# Patient Record
Sex: Male | Born: 1965 | Race: Asian | Hispanic: No | Marital: Single | Smoking: Never smoker
Health system: Southern US, Community
[De-identification: ages and names within clinical notes are randomized; demographics above are authoritative.]

## PROBLEM LIST (undated history)

## (undated) DIAGNOSIS — E119 Type 2 diabetes mellitus without complications: Secondary | ICD-10-CM

## (undated) DIAGNOSIS — K649 Unspecified hemorrhoids: Secondary | ICD-10-CM

## (undated) DIAGNOSIS — K219 Gastro-esophageal reflux disease without esophagitis: Secondary | ICD-10-CM

## (undated) DIAGNOSIS — I10 Essential (primary) hypertension: Secondary | ICD-10-CM

## (undated) DIAGNOSIS — B1920 Unspecified viral hepatitis C without hepatic coma: Secondary | ICD-10-CM

## (undated) HISTORY — DX: Type 2 diabetes mellitus without complications: E11.9

## (undated) HISTORY — DX: Unspecified hemorrhoids: K64.9

## (undated) HISTORY — DX: Essential (primary) hypertension: I10

## (undated) HISTORY — DX: Unspecified viral hepatitis C without hepatic coma: B19.20

## (undated) HISTORY — DX: Gastro-esophageal reflux disease without esophagitis: K21.9

## (undated) HISTORY — PX: APPENDECTOMY: SHX54

---

## 2012-09-10 ENCOUNTER — Encounter (HOSPITAL_COMMUNITY): Payer: Self-pay | Admitting: Emergency Medicine

## 2012-09-10 ENCOUNTER — Emergency Department (HOSPITAL_COMMUNITY)
Admission: EM | Admit: 2012-09-10 | Discharge: 2012-09-11 | Disposition: A | Discharge status: Home / Self Care | Payer: BC Managed Care – PPO | Attending: Emergency Medicine | Admitting: Emergency Medicine

## 2012-09-10 DIAGNOSIS — E876 Hypokalemia: Secondary | ICD-10-CM | POA: Insufficient documentation

## 2012-09-10 DIAGNOSIS — E1169 Type 2 diabetes mellitus with other specified complication: Secondary | ICD-10-CM | POA: Insufficient documentation

## 2012-09-10 DIAGNOSIS — E162 Hypoglycemia, unspecified: Secondary | ICD-10-CM

## 2012-09-10 DIAGNOSIS — Z9089 Acquired absence of other organs: Secondary | ICD-10-CM | POA: Insufficient documentation

## 2012-09-10 DIAGNOSIS — R109 Unspecified abdominal pain: Secondary | ICD-10-CM | POA: Insufficient documentation

## 2012-09-10 DIAGNOSIS — Z794 Long term (current) use of insulin: Secondary | ICD-10-CM | POA: Insufficient documentation

## 2012-09-10 DIAGNOSIS — R7401 Elevation of levels of liver transaminase levels: Secondary | ICD-10-CM

## 2012-09-10 DIAGNOSIS — R7402 Elevation of levels of lactic acid dehydrogenase (LDH): Secondary | ICD-10-CM | POA: Insufficient documentation

## 2012-09-10 DIAGNOSIS — Z79899 Other long term (current) drug therapy: Secondary | ICD-10-CM | POA: Insufficient documentation

## 2012-09-10 DIAGNOSIS — R000 Tachycardia, unspecified: Secondary | ICD-10-CM | POA: Insufficient documentation

## 2012-09-10 HISTORY — DX: Type 2 diabetes mellitus without complications: E11.9

## 2012-09-10 LAB — POCT I-STAT, CHEM 8
Chloride: 100 mEq/L (ref 96–112)
HCT: 51 % (ref 39.0–52.0)
Hemoglobin: 17.3 g/dL — ABNORMAL HIGH (ref 13.0–17.0)
Potassium: 3.2 mEq/L — ABNORMAL LOW (ref 3.5–5.1)
Sodium: 140 mEq/L (ref 135–145)

## 2012-09-10 LAB — CBC WITH DIFFERENTIAL/PLATELET
Basophils Absolute: 0 10*3/uL (ref 0.0–0.1)
Eosinophils Relative: 0 % (ref 0–5)
HCT: 44.2 % (ref 39.0–52.0)
Lymphocytes Relative: 12 % (ref 12–46)
MCV: 72.2 fL — ABNORMAL LOW (ref 78.0–100.0)
Monocytes Absolute: 0.3 10*3/uL (ref 0.1–1.0)
Monocytes Relative: 4 % (ref 3–12)
RDW: 14.5 % (ref 11.5–15.5)
WBC: 8.2 10*3/uL (ref 4.0–10.5)

## 2012-09-10 LAB — GLUCOSE, CAPILLARY
Glucose-Capillary: 157 mg/dL — ABNORMAL HIGH (ref 70–99)
Glucose-Capillary: 98 mg/dL (ref 70–99)

## 2012-09-10 MED ORDER — DEXTROSE 50 % IV SOLN
INTRAVENOUS | Status: AC
  Administered 2012-09-10: 50 mL
  Filled 2012-09-10: qty 50

## 2012-09-10 NOTE — ED Notes (Signed)
PT. REPORTS LEFT FLANK PAIN FOR SEVERAL DAYS , DENIES DYSURIA OR HEMATURIA , SLIGHT CONCENTRATED URINE , HYPOGLYCEMIC AT TRIAGE.

## 2012-09-10 NOTE — ED Notes (Signed)
Pt. Given juice and [place] sandwich at triage , alert and oriebnted , slight diffused abdominal pain .

## 2012-09-11 LAB — COMPREHENSIVE METABOLIC PANEL
AST: 59 U/L — ABNORMAL HIGH (ref 0–37)
BUN: 8 mg/dL (ref 6–23)
CO2: 20 mEq/L (ref 19–32)
Calcium: 10.3 mg/dL (ref 8.4–10.5)
Creatinine, Ser: 0.76 mg/dL (ref 0.50–1.35)
GFR calc Af Amer: >90 mL/min (ref >90)
GFR calc non Af Amer: >90 mL/min (ref >90)
Glucose, Bld: 230 mg/dL — ABNORMAL HIGH (ref 70–99)

## 2012-09-11 LAB — URINALYSIS, ROUTINE W REFLEX MICROSCOPIC
Nitrite: NEGATIVE
Protein, ur: NEGATIVE mg/dL
Specific Gravity, Urine: 1.015 (ref 1.005–1.030)
Urobilinogen, UA: 0.2 mg/dL (ref 0.0–1.0)

## 2012-09-11 LAB — GLUCOSE, CAPILLARY: Glucose-Capillary: 199 mg/dL — ABNORMAL HIGH (ref 70–99)

## 2012-09-11 MED ORDER — POTASSIUM CHLORIDE CRYS ER 20 MEQ PO TBCR: 20.0000 meq | EXTENDED_RELEASE_TABLET | Freq: Two times a day (BID) | ORAL | Status: DC

## 2012-09-11 MED ORDER — NAPROXEN 500 MG PO TABS: 500.0000 mg | ORAL_TABLET | Freq: Two times a day (BID) | ORAL | Status: DC

## 2012-09-11 MED ORDER — KETOROLAC TROMETHAMINE 30 MG/ML IJ SOLN
30.0000 mg | Freq: Once | INTRAMUSCULAR | Status: AC
  Administered 2012-09-11: 30 mg via INTRAVENOUS
  Filled 2012-09-11: qty 1

## 2012-09-11 MED ORDER — SODIUM CHLORIDE 0.9 % IV BOLUS (SEPSIS)
1000.0000 mL | Freq: Once | INTRAVENOUS | Status: AC
  Administered 2012-09-11: 1000 mL via INTRAVENOUS

## 2012-09-11 MED ORDER — POTASSIUM CHLORIDE CRYS ER 20 MEQ PO TBCR
40.0000 meq | EXTENDED_RELEASE_TABLET | Freq: Once | ORAL | Status: AC
  Administered 2012-09-11: 40 meq via ORAL
  Filled 2012-09-11: qty 2

## 2012-09-11 NOTE — ED Provider Notes (Signed)
History     CSN: [id number]  Arrival date & time 09/10/12  2210   First MD Initiated Contact with Patient 09/10/12 2307      Chief Complaint  Patient presents with  . Flank Pain  . Hypoglycemia    (Consider location/radiation/quality/duration/timing/severity/associated sxs/prior treatment) HPI Comments: 47 year old male with a history of diabetes, recent appendectomy one month ago that was done laparoscopically.  He presents with a burning type pain which is on his left flank and side at his iliac crest which has been constant, nothing seems to make it better or worse and it is not associated with nausea vomiting or dysuria or hematuria. He has no abdominal pain, no swelling, no rashes, no chest pain, no cough, no fevers.  Patient is a 47 y.o. male presenting with flank pain. The history is provided by the patient.  Flank Pain    Past Medical History  Diagnosis Date  . Diabetes mellitus without complication     Past Surgical History  Procedure Date  . Appendectomy     No family history on file.  History  Substance Use Topics  . Smoking status: Never Smoker   . Smokeless tobacco: Not on file  . Alcohol Use: No      Review of Systems  Genitourinary: Positive for flank pain.  All other systems reviewed and are negative.    Allergies  Review of patient's allergies indicates no known allergies.  Home Medications   Current Outpatient Rx  Name  Route  Sig  Dispense  Refill  . GABAPENTIN 300 MG PO CAPS   Oral   Take 300 mg by mouth 3 (three) times daily.         . INSULIN DETEMIR 100 UNIT/ML [place] SOLN   Subcutaneous   Inject 58 Units into the skin daily.         Marland Kitchen LISINOPRIL 2.5 MG PO TABS   Oral   Take 2.5 mg by mouth daily.         Marland Kitchen METFORMIN HCL 500 MG PO TABS   Oral   Take 500 mg by mouth 2 (two) times daily with a meal.         . PANTOPRAZOLE SODIUM 40 MG PO TBEC   Oral   Take 40 mg by mouth daily.         Marland Kitchen NAPROXEN 500 MG PO TABS  Oral   Take 1 tablet (500 mg total) by mouth 2 (two) times daily with a meal.   30 tablet   0   . POTASSIUM CHLORIDE CRYS ER 20 MEQ PO TBCR   Oral   Take 1 tablet (20 mEq total) by mouth 2 (two) times daily.   10 tablet   0     BP 122/77  Pulse 115  Temp 97.3 F (36.3 C) (Oral)  Resp 22  SpO2 100%  Physical Exam  Nursing note and vitals reviewed. Constitutional: He appears well-developed and well-nourished. No distress.  HENT:  Head: Normocephalic and atraumatic.  Mouth/Throat: Oropharynx is clear and moist. No oropharyngeal exudate.  Eyes: Conjunctivae normal and EOM are normal. Pupils are equal, round, and reactive to light. Right eye exhibits no discharge. Left eye exhibits no discharge. No scleral icterus.  Neck: Normal range of motion. Neck supple. No JVD present. No thyromegaly present.  Cardiovascular: Normal rate, regular rhythm, normal heart sounds and intact distal pulses.  Exam reveals no gallop and no friction rub.   No murmur heard. Pulmonary/Chest: Effort normal and  breath sounds normal. No respiratory distress. He has no wheezes. He has no rales.  Abdominal: Soft. Bowel sounds are normal. He exhibits no distension and no mass. There is no tenderness.       There are surgical scars that are well-healed from his appendectomy, the patient is otherwise totally benign with no abdominal tenderness, no guarding, very soft and normal bowel sounds. no CVA tenderness  Musculoskeletal: Normal range of motion. He exhibits no edema and no tenderness.  Lymphadenopathy:    He has no cervical adenopathy.  Neurological: He is alert. Coordination normal.  Skin: Skin is warm and dry. No rash noted. No erythema.       There is no rash over the left flank and side  Psychiatric: He has a normal mood and affect. His behavior is normal.    ED Course  Procedures (including critical care time)  Labs Reviewed  GLUCOSE, CAPILLARY - Abnormal; Notable for the following:     Glucose-Capillary 23 (*)     All other components within normal limits  CBC WITH DIFFERENTIAL - Abnormal; Notable for the following:    RBC 6.12 (*)     MCV 72.2 (*)     MCH 24.2 (*)     Neutrophils Relative 84 (*)     All other components within normal limits  COMPREHENSIVE METABOLIC PANEL - Abnormal; Notable for the following:    Sodium 134 (*)     Chloride 92 (*)     Glucose, Bld 230 (*)     Total Protein 9.0 (*)     AST 59 (*)     ALT 107 (*)     All other components within normal limits  POCT I-STAT, CHEM 8 - Abnormal; Notable for the following:    Potassium 3.2 (*)     Glucose, Bld 59 (*)     Hemoglobin 17.3 (*)     All other components within normal limits  URINALYSIS, ROUTINE W REFLEX MICROSCOPIC - Abnormal; Notable for the following:    Glucose, UA 500 (*)     All other components within normal limits  GLUCOSE, CAPILLARY - Abnormal; Notable for the following:    Glucose-Capillary 157 (*)     All other components within normal limits  GLUCOSE, CAPILLARY - Abnormal; Notable for the following:    Glucose-Capillary 199 (*)     All other components within normal limits  GLUCOSE, CAPILLARY   No results found.   1. Side pain   2. Transaminitis   3. Hypoglycemia       MDM  The patient does have a mild tachycardia. He does not have a leukocytosis, he does not have a significant anemia but he does have a slight hypokalemia. Did have a low blood sugar at 23, he was given food in triage, he will be given IV fluids and recheck blood sugar, patient appears stable at this time. I doubt that this is a complication of the surgery given his completely soft and nontender abdomen.  The patient has had significant improvement in his blood sugar after eating though it was significant at 23 on arrival it has improved to over 100. It has stayed in that range and he has maintained a normal mental status since that time. His laboratory workup shows mild hypokalemia and a mild  transaminitis, no other significant findings. The patient has been made aware of these results, will need followup for recheck, patient agrees with same.      Vida Roller,  MD 09/11/12 0302

## 2012-09-19 ENCOUNTER — Encounter (HOSPITAL_COMMUNITY): Payer: Self-pay | Admitting: Family Medicine

## 2012-09-19 ENCOUNTER — Emergency Department (HOSPITAL_COMMUNITY): Payer: BC Managed Care – PPO

## 2012-09-19 ENCOUNTER — Emergency Department (HOSPITAL_COMMUNITY)
Admission: EM | Admit: 2012-09-19 | Discharge: 2012-09-19 | Disposition: A | Discharge status: Home / Self Care | Payer: BC Managed Care – PPO | Attending: Emergency Medicine | Admitting: Emergency Medicine

## 2012-09-19 DIAGNOSIS — R109 Unspecified abdominal pain: Secondary | ICD-10-CM | POA: Insufficient documentation

## 2012-09-19 DIAGNOSIS — Z794 Long term (current) use of insulin: Secondary | ICD-10-CM | POA: Insufficient documentation

## 2012-09-19 DIAGNOSIS — Z79899 Other long term (current) drug therapy: Secondary | ICD-10-CM | POA: Insufficient documentation

## 2012-09-19 DIAGNOSIS — R110 Nausea: Secondary | ICD-10-CM | POA: Insufficient documentation

## 2012-09-19 DIAGNOSIS — E119 Type 2 diabetes mellitus without complications: Secondary | ICD-10-CM | POA: Insufficient documentation

## 2012-09-19 DIAGNOSIS — Z9089 Acquired absence of other organs: Secondary | ICD-10-CM | POA: Insufficient documentation

## 2012-09-19 LAB — COMPREHENSIVE METABOLIC PANEL
Albumin: 4.3 g/dL (ref 3.5–5.2)
Alkaline Phosphatase: 115 U/L (ref 39–117)
BUN: 8 mg/dL (ref 6–23)
Calcium: 9.6 mg/dL (ref 8.4–10.5)
Creatinine, Ser: 0.87 mg/dL (ref 0.50–1.35)
GFR calc Af Amer: >90 mL/min (ref >90)
Glucose, Bld: 240 mg/dL — ABNORMAL HIGH (ref 70–99)
Potassium: 4.5 mEq/L (ref 3.5–5.1)
Total Protein: 7.7 g/dL (ref 6.0–8.3)

## 2012-09-19 LAB — CBC WITH DIFFERENTIAL/PLATELET
Basophils Relative: 0 % (ref 0–1)
Eosinophils Absolute: 0.1 10*3/uL (ref 0.0–0.7)
Eosinophils Relative: 2 % (ref 0–5)
Hemoglobin: 13.1 g/dL (ref 13.0–17.0)
Lymphs Abs: 2 10*3/uL (ref 0.7–4.0)
MCH: 24.6 pg — ABNORMAL LOW (ref 26.0–34.0)
MCHC: 32.8 g/dL (ref 30.0–36.0)
MCV: 75 fL — ABNORMAL LOW (ref 78.0–100.0)
Monocytes Relative: 8 % (ref 3–12)
Neutrophils Relative %: 42 % — ABNORMAL LOW (ref 43–77)

## 2012-09-19 LAB — URINALYSIS, ROUTINE W REFLEX MICROSCOPIC
Bilirubin Urine: NEGATIVE
Hgb urine dipstick: NEGATIVE
Ketones, ur: NEGATIVE mg/dL
Nitrite: NEGATIVE
Urobilinogen, UA: 0.2 mg/dL (ref 0.0–1.0)
pH: 6 (ref 5.0–8.0)

## 2012-09-19 MED ORDER — SODIUM CHLORIDE 0.9 % IV BOLUS (SEPSIS)
1000.0000 mL | Freq: Once | INTRAVENOUS | Status: AC
  Administered 2012-09-19: 1000 mL via INTRAVENOUS

## 2012-09-19 MED ORDER — KETOROLAC TROMETHAMINE 30 MG/ML IJ SOLN
30.0000 mg | Freq: Once | INTRAMUSCULAR | Status: AC
  Administered 2012-09-19: 30 mg via INTRAVENOUS
  Filled 2012-09-19: qty 1

## 2012-09-19 MED ORDER — HYDROCODONE-ACETAMINOPHEN 5-325 MG PO TABS: 1.0000 | ORAL_TABLET | ORAL | Status: DC | PRN

## 2012-09-19 MED ORDER — NAPROXEN 500 MG PO TABS: 500.0000 mg | ORAL_TABLET | Freq: Two times a day (BID) | ORAL | Status: DC

## 2012-09-19 MED ORDER — ONDANSETRON HCL 4 MG/2ML IJ SOLN
4.0000 mg | Freq: Once | INTRAMUSCULAR | Status: AC
  Administered 2012-09-19: 4 mg via INTRAVENOUS
  Filled 2012-09-19: qty 2

## 2012-09-19 NOTE — ED Notes (Signed)
Per pt left lower back pain and flank pain. sts he gets hot and sweats.

## 2012-09-19 NOTE — ED Provider Notes (Signed)
History     CSN: [id number]  Arrival date & time 09/19/12  1627   First MD Initiated Contact with Patient 09/19/12 2003      Chief Complaint  Patient presents with  . Flank Pain    (Consider location/radiation/quality/duration/timing/severity/associated sxs/prior treatment) Patient is a 47 y.o. male presenting with flank pain. The history is provided by the patient. The history is limited by a language barrier. A language interpreter was used.  Flank Pain This is a chronic problem. The current episode started more than 1 month ago. The problem occurs constantly. The problem has been unchanged. Associated symptoms include nausea. Pertinent negatives include no abdominal pain, arthralgias, chest pain, congestion, fatigue, fever, headaches, rash, vomiting or weakness. Nothing aggravates the symptoms. He has tried NSAIDs for the symptoms. The treatment provided moderate relief.    Past Medical History  Diagnosis Date  . Diabetes mellitus without complication     Past Surgical History  Procedure Date  . Appendectomy     History reviewed. No pertinent family history.  History  Substance Use Topics  . Smoking status: Never Smoker   . Smokeless tobacco: Not on file  . Alcohol Use: No      Review of Systems  Constitutional: Negative for fever and fatigue.  HENT: Negative for congestion, rhinorrhea and postnasal drip.   Eyes: Negative for photophobia and visual disturbance.  Respiratory: Negative for chest tightness, shortness of breath and wheezing.   Cardiovascular: Negative for chest pain, palpitations and leg swelling.  Gastrointestinal: Positive for nausea. Negative for vomiting, abdominal pain and diarrhea.  Genitourinary: Positive for flank pain. Negative for urgency, frequency and difficulty urinating.  Musculoskeletal: Negative for back pain and arthralgias.  Skin: Negative for rash and wound.  Neurological: Positive for dizziness. Negative for weakness and  headaches.  Psychiatric/Behavioral: Negative for confusion and agitation.    Allergies  Review of patient's allergies indicates no known allergies.  Home Medications   Current Outpatient Rx  Name  Route  Sig  Dispense  Refill  . GABAPENTIN 300 MG PO CAPS   Oral   Take 900 mg by mouth 3 (three) times daily.          . INSULIN DETEMIR 100 UNIT/ML [place] SOLN   Subcutaneous   Inject 58 Units into the skin daily.         Marland Kitchen LISINOPRIL 5 MG PO TABS   Oral   Take 2.5 mg by mouth daily.         Marland Kitchen METFORMIN HCL 500 MG PO TABS   Oral   Take 1,000 mg by mouth 2 (two) times daily with a meal.          . NAPROXEN 500 MG PO TABS   Oral   Take 1 tablet (500 mg total) by mouth 2 (two) times daily with a meal.   30 tablet   0   . PANTOPRAZOLE SODIUM 40 MG PO TBEC   Oral   Take 40 mg by mouth daily.         Marland Kitchen POTASSIUM CHLORIDE CRYS ER 20 MEQ PO TBCR   Oral   Take 1 tablet (20 mEq total) by mouth 2 (two) times daily.   10 tablet   0   . HYDROCODONE-ACETAMINOPHEN 5-325 MG PO TABS   Oral   Take 1 tablet by mouth every 4 (four) hours as needed for pain.   6 tablet   0   . NAPROXEN 500 MG PO TABS   Oral  Take 1 tablet (500 mg total) by mouth 2 (two) times daily.   30 tablet   0     BP 132/84  Pulse 73  Temp 97.7 F (36.5 C) (Oral)  Resp 16  SpO2 100%  Physical Exam  Constitutional: He is oriented to person, place, and time. He appears well-developed and well-nourished. No distress.  HENT:  Head: Normocephalic and atraumatic.  Mouth/Throat: Oropharynx is clear and moist.  Eyes: EOM are normal. Pupils are equal, round, and reactive to light.  Neck: Normal range of motion. Neck supple.  Cardiovascular: Normal rate, regular rhythm, normal heart sounds and intact distal pulses.   Pulmonary/Chest: Effort normal and breath sounds normal. He has no wheezes. He has no rales.  Abdominal: Soft. Bowel sounds are normal. He exhibits no distension. There is no tenderness.  There is no rebound and no guarding.       No abdominal or left flank tenderness.  Musculoskeletal: Normal range of motion. He exhibits no edema and no tenderness.       No CVA tenderness.  Lymphadenopathy:    He has no cervical adenopathy.  Neurological: He is alert and oriented to person, place, and time. He displays normal reflexes. No cranial nerve deficit. He exhibits normal muscle tone. Coordination normal.  Skin: Skin is warm and dry. No rash noted.       No dermatomal skin rashes  Psychiatric: He has a normal mood and affect. His behavior is normal.    ED Course  Procedures (including critical care time)  Labs Reviewed  URINALYSIS, ROUTINE W REFLEX MICROSCOPIC - Abnormal; Notable for the following:    Glucose, UA 100 (*)     All other components within normal limits  CBC WITH DIFFERENTIAL - Abnormal; Notable for the following:    MCV 75.0 (*)     MCH 24.6 (*)     Neutrophils Relative 42 (*)     Lymphocytes Relative 49 (*)     All other components within normal limits  COMPREHENSIVE METABOLIC PANEL - Abnormal; Notable for the following:    Glucose, Bld 240 (*)     AST 123 (*)     ALT 181 (*)     Total Bilirubin 0.2 (*)     All other components within normal limits   Ct Abdomen Pelvis Wo Contrast  09/19/2012  *RADIOLOGY REPORT*  Clinical Data: Left lower back pain and flank pain.  CT ABDOMEN AND PELVIS WITHOUT CONTRAST  Technique:  Multidetector CT imaging of the abdomen and pelvis was performed following the standard protocol without intravenous contrast.  Comparison: None.  Findings: Minimal bibasilar atelectasis is noted.  The liver and spleen are unremarkable in appearance.  The gallbladder is within normal limits.  The pancreas and adrenal glands are unremarkable.  The kidneys are unremarkable in appearance.  There is no evidence of hydronephrosis.  No renal or ureteral stones are seen.  No perinephric stranding is appreciated.  No free fluid is identified.  The small  bowel is unremarkable in appearance.  The stomach is within normal limits.  No acute vascular abnormalities are seen.  The patient is status post appendectomy, with scattered associated postoperative change.  The colon is largely filled with stool and is unremarkable in appearance.  The bladder is mildly distended and grossly unremarkable.  The prostate remains normal in size.  No inguinal lymphadenopathy is seen.  No acute osseous abnormalities are identified.  Chronic bilateral pars defects are seen at L5, with mild grade  1 anterolisthesis of L5 on S1.  IMPRESSION:  1.  No acute abnormality seen within the abdomen or pelvis.  No evidence of hydronephrosis; no obstructing ureteral stones seen. 2.  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   Original Report Authenticated By: Tonia Ghent, M.D.      1. Left flank pain       MDM  72M with pmhx of DM and recently had appendectomy 1 month ago here with left flank pain, nausea, and dizziness for the last month. Was seen here 9 days ago for same but was also noted to be hypoglycemic. Exam as noted above. Vitals are stable. Afebrile. Abdomen soft and non-tender. No overlying rash. No concern for serious intra-abdominal pathology but due to patient's second visit will obtain non-constrast CT to look for small stone or maybe large renal mass vs bony lesion. Given Toradol and Zofran for symptoms along with IVF.  CT negative for acute pathology. Labs reveal baseline LFT elevation. Normal urine. Potassium normal. Pt feeling better after medications. Will d/c with rx for naproxen and 6 tablets of Vicodin. Instructed patient to see pcp for further evaluation of pain. [place] translator utilized during entire visit. Pt discharged in stable condition and had no further questions or concerns prior to leaving.        Johnnette Gourd, MD 09/20/12 [phone]

## 2012-09-20 NOTE — ED Provider Notes (Signed)
I saw and evaluated the patient, reviewed the resident's note and I agree with the findings and plan. Patient complaining of thoracic/CVA pain. He denies any dysuria, fever or vomiting. Labs are within normal limits. Urine is normal in CT shows no evidence of renal tumor, stone or other abnormalities. Feel most likely arthritis in nature and patient placed on naproxen which he states in the past has helped  Gwyneth Sprout, MD 09/20/12 2320

## 2013-01-14 ENCOUNTER — Emergency Department (HOSPITAL_COMMUNITY): Payer: BC Managed Care – PPO

## 2013-01-14 ENCOUNTER — Encounter (HOSPITAL_COMMUNITY): Payer: Self-pay

## 2013-01-14 ENCOUNTER — Emergency Department (HOSPITAL_COMMUNITY)
Admission: EM | Admit: 2013-01-14 | Discharge: 2013-01-14 | Disposition: A | Discharge status: Home / Self Care | Payer: BC Managed Care – PPO | Attending: Emergency Medicine | Admitting: Emergency Medicine

## 2013-01-14 DIAGNOSIS — R112 Nausea with vomiting, unspecified: Secondary | ICD-10-CM | POA: Insufficient documentation

## 2013-01-14 DIAGNOSIS — E119 Type 2 diabetes mellitus without complications: Secondary | ICD-10-CM | POA: Insufficient documentation

## 2013-01-14 DIAGNOSIS — Z79899 Other long term (current) drug therapy: Secondary | ICD-10-CM | POA: Insufficient documentation

## 2013-01-14 DIAGNOSIS — R109 Unspecified abdominal pain: Secondary | ICD-10-CM | POA: Insufficient documentation

## 2013-01-14 DIAGNOSIS — IMO0001 Reserved for inherently not codable concepts without codable children: Secondary | ICD-10-CM | POA: Insufficient documentation

## 2013-01-14 DIAGNOSIS — R5383 Other fatigue: Secondary | ICD-10-CM

## 2013-01-14 DIAGNOSIS — R5381 Other malaise: Secondary | ICD-10-CM

## 2013-01-14 DIAGNOSIS — Z794 Long term (current) use of insulin: Secondary | ICD-10-CM | POA: Insufficient documentation

## 2013-01-14 LAB — CBC WITH DIFFERENTIAL/PLATELET
Basophils Absolute: 0 10*3/uL (ref 0.0–0.1)
Basophils Relative: 0 % (ref 0–1)
Eosinophils Relative: 0 % (ref 0–5)
HCT: 45 % (ref 39.0–52.0)
Hemoglobin: 16 g/dL (ref 13.0–17.0)
Lymphocytes Relative: 37 % (ref 12–46)
Lymphs Abs: 1.9 10*3/uL (ref 0.7–4.0)
MCV: 69.6 fL — ABNORMAL LOW (ref 78.0–100.0)
Monocytes Relative: 7 % (ref 3–12)
Neutro Abs: 2.7 10*3/uL (ref 1.7–7.7)
RBC: 6.47 MIL/uL — ABNORMAL HIGH (ref 4.22–5.81)
RDW: 13.2 % (ref 11.5–15.5)
WBC: 5 10*3/uL (ref 4.0–10.5)

## 2013-01-14 LAB — URINALYSIS, ROUTINE W REFLEX MICROSCOPIC
Bilirubin Urine: NEGATIVE
Glucose, UA: NEGATIVE mg/dL
Hgb urine dipstick: NEGATIVE
Ketones, ur: 15 mg/dL — AB
Leukocytes, UA: NEGATIVE
Nitrite: NEGATIVE
Protein, ur: NEGATIVE mg/dL
Specific Gravity, Urine: 1.01 (ref 1.005–1.030)
Urobilinogen, UA: 1 mg/dL (ref 0.0–1.0)
pH: 8 (ref 5.0–8.0)

## 2013-01-14 LAB — BASIC METABOLIC PANEL
BUN: 12 mg/dL (ref 6–23)
CO2: 22 mEq/L (ref 19–32)
Chloride: 98 mEq/L (ref 96–112)
Creatinine, Ser: 1.09 mg/dL (ref 0.50–1.35)
GFR calc Af Amer: >90 mL/min (ref >90)
Potassium: 4.2 mEq/L (ref 3.5–5.1)

## 2013-01-14 LAB — D-DIMER, QUANTITATIVE: D-Dimer, Quant: <0.27 ug/mL-FEU (ref 0.00–0.48)

## 2013-01-14 LAB — CG4 I-STAT (LACTIC ACID): Lactic Acid, Venous: 2.04 mmol/L (ref 0.5–2.2)

## 2013-01-14 LAB — LIPASE, BLOOD: Lipase: 57 U/L (ref 11–59)

## 2013-01-14 LAB — GLUCOSE, CAPILLARY: Glucose-Capillary: 80 mg/dL (ref 70–99)

## 2013-01-14 MED ORDER — SODIUM CHLORIDE 0.9 % IV BOLUS (SEPSIS)
1000.0000 mL | Freq: Once | INTRAVENOUS | Status: AC
  Administered 2013-01-14: 1000 mL via INTRAVENOUS

## 2013-01-14 MED ORDER — MORPHINE SULFATE 4 MG/ML IJ SOLN
4.0000 mg | Freq: Once | INTRAMUSCULAR | Status: AC
  Administered 2013-01-14: 4 mg via INTRAVENOUS
  Filled 2013-01-14: qty 1

## 2013-01-14 MED ORDER — ONDANSETRON HCL 4 MG/2ML IJ SOLN
4.0000 mg | Freq: Once | INTRAMUSCULAR | Status: AC
Start: 2013-01-14 — End: 2013-01-14
  Administered 2013-01-14: 4 mg via INTRAVENOUS
  Filled 2013-01-14: qty 2

## 2013-01-14 MED ORDER — IOHEXOL 300 MG/ML  SOLN
50.0000 mL | Freq: Once | INTRAMUSCULAR | Status: AC | PRN
  Administered 2013-01-14: 50 mL via ORAL

## 2013-01-14 MED ORDER — IOHEXOL 300 MG/ML  SOLN
100.0000 mL | Freq: Once | INTRAMUSCULAR | Status: AC | PRN
  Administered 2013-01-14: 100 mL via INTRAVENOUS

## 2013-01-14 MED ORDER — ONDANSETRON HCL 4 MG/2ML IJ SOLN
4.0000 mg | Freq: Once | INTRAMUSCULAR | Status: AC
  Administered 2013-01-14: 4 mg via INTRAVENOUS
  Filled 2013-01-14: qty 2

## 2013-01-14 NOTE — ED Notes (Signed)
Per EMS nephew reports that patient was not easily aroused this morning. He is not acting like himself, did not want to get dressed.Denies CP, HA, no abdominal pain. He is following commands. Acknowledges emesis yesterday X1, no diarrhea. Recently had physical which he had blood drawn- nephew states physical was unremarkable.  145/92, 84, 100%  Hx DM cbg 77.

## 2013-01-14 NOTE — ED Notes (Addendum)
Patient speaks [place]ian. Complaining of chest pain and abdominal pain.  Periods of shaking. Unable to void at this time. Nephew is at bedside. Patient has been following commands as appropriately as to be expected. Patient requesting bread because he is hungry.

## 2013-01-14 NOTE — ED Notes (Signed)
[id number]<AV> Expected date:01/14/13<BR> Expected time:12:00 PM<BR> Means of arrival:Ambulance<BR> Comments:<BR> Weakness

## 2013-01-15 ENCOUNTER — Emergency Department (HOSPITAL_COMMUNITY)
Admission: EM | Admit: 2013-01-15 | Discharge: 2013-01-16 | Disposition: A | Discharge status: Home / Self Care | Payer: BC Managed Care – PPO | Attending: Emergency Medicine | Admitting: Emergency Medicine

## 2013-01-15 ENCOUNTER — Encounter (HOSPITAL_COMMUNITY): Payer: Self-pay | Admitting: *Deleted

## 2013-01-15 DIAGNOSIS — R1013 Epigastric pain: Secondary | ICD-10-CM

## 2013-01-15 DIAGNOSIS — Z9089 Acquired absence of other organs: Secondary | ICD-10-CM | POA: Insufficient documentation

## 2013-01-15 DIAGNOSIS — Z794 Long term (current) use of insulin: Secondary | ICD-10-CM | POA: Insufficient documentation

## 2013-01-15 DIAGNOSIS — E119 Type 2 diabetes mellitus without complications: Secondary | ICD-10-CM | POA: Insufficient documentation

## 2013-01-15 DIAGNOSIS — Z79899 Other long term (current) drug therapy: Secondary | ICD-10-CM | POA: Insufficient documentation

## 2013-01-15 DIAGNOSIS — R5381 Other malaise: Secondary | ICD-10-CM | POA: Insufficient documentation

## 2013-01-15 DIAGNOSIS — R110 Nausea: Secondary | ICD-10-CM

## 2013-01-15 DIAGNOSIS — R5383 Other fatigue: Secondary | ICD-10-CM | POA: Insufficient documentation

## 2013-01-15 LAB — GLUCOSE, CAPILLARY

## 2013-01-15 MED ORDER — HYDROMORPHONE HCL PF 1 MG/ML IJ SOLN
1.0000 mg | Freq: Once | INTRAMUSCULAR | Status: AC
  Administered 2013-01-15: 1 mg via INTRAVENOUS
  Filled 2013-01-15: qty 1

## 2013-01-15 MED ORDER — ASPIRIN 325 MG PO TABS
325.0000 mg | ORAL_TABLET | ORAL | Status: AC
  Administered 2013-01-15: 325 mg via ORAL
  Filled 2013-01-15: qty 1

## 2013-01-15 MED ORDER — LACTATED RINGERS IV BOLUS (SEPSIS)
500.0000 mL | Freq: Once | INTRAVENOUS | Status: AC
  Administered 2013-01-15: 500 mL via INTRAVENOUS

## 2013-01-15 MED ORDER — NITROGLYCERIN 0.4 MG SL SUBL
0.4000 mg | SUBLINGUAL_TABLET | SUBLINGUAL | Status: DC | PRN
  Filled 2013-01-15: qty 25

## 2013-01-15 MED ORDER — ONDANSETRON HCL 4 MG/2ML IJ SOLN
4.0000 mg | Freq: Once | INTRAMUSCULAR | Status: AC
  Administered 2013-01-15: 4 mg via INTRAVENOUS
  Filled 2013-01-15: qty 2

## 2013-01-15 NOTE — ED Notes (Signed)
Pt in c/o chest pain x2 days, was seen here Saturday for feeling fatigue, here today due to pain that started back today, pain to central chest, pt hyperventilating during triage and he states that is due to pain, c/o nausea but denies vomiting

## 2013-01-16 ENCOUNTER — Encounter (HOSPITAL_COMMUNITY): Payer: Self-pay

## 2013-01-16 ENCOUNTER — Emergency Department (HOSPITAL_COMMUNITY): Payer: BC Managed Care – PPO

## 2013-01-16 LAB — COMPREHENSIVE METABOLIC PANEL
ALT: 97 U/L — ABNORMAL HIGH (ref 0–53)
BUN: 8 mg/dL (ref 6–23)
CO2: 21 mEq/L (ref 19–32)
Calcium: 9.7 mg/dL (ref 8.4–10.5)
GFR calc Af Amer: >90 mL/min (ref >90)
GFR calc non Af Amer: 89 mL/min — ABNORMAL LOW (ref >90)
Glucose, Bld: 285 mg/dL — ABNORMAL HIGH (ref 70–99)
Sodium: 134 mEq/L — ABNORMAL LOW (ref 135–145)
Total Protein: 7.7 g/dL (ref 6.0–8.3)

## 2013-01-16 LAB — CBC WITH DIFFERENTIAL/PLATELET
Basophils Relative: 0 % (ref 0–1)
Eosinophils Absolute: 0 10*3/uL (ref 0.0–0.7)
Eosinophils Relative: 0 % (ref 0–5)
HCT: 40.6 % (ref 39.0–52.0)
Hemoglobin: 13.8 g/dL (ref 13.0–17.0)
Lymphs Abs: 2.2 10*3/uL (ref 0.7–4.0)
MCH: 23.9 pg — ABNORMAL LOW (ref 26.0–34.0)
MCHC: 34 g/dL (ref 30.0–36.0)
MCV: 70.4 fL — ABNORMAL LOW (ref 78.0–100.0)
Monocytes Absolute: 0.6 10*3/uL (ref 0.1–1.0)
Neutro Abs: 2.2 10*3/uL (ref 1.7–7.7)
RBC: 5.77 MIL/uL (ref 4.22–5.81)

## 2013-01-16 LAB — URINALYSIS, ROUTINE W REFLEX MICROSCOPIC
Bilirubin Urine: NEGATIVE
Glucose, UA: 500 mg/dL — AB
Ketones, ur: NEGATIVE mg/dL
Leukocytes, UA: NEGATIVE
Specific Gravity, Urine: 1.016 (ref 1.005–1.030)
pH: 7 (ref 5.0–8.0)

## 2013-01-16 LAB — HEPATITIS PANEL, ACUTE
HCV Ab: REACTIVE — AB
Hep A IgM: NEGATIVE
Hep B C IgM: NEGATIVE

## 2013-01-16 LAB — BILIRUBIN, DIRECT: Bilirubin, Direct: 0.1 mg/dL (ref 0.0–0.3)

## 2013-01-16 MED ORDER — HYDROCODONE-ACETAMINOPHEN 5-325 MG PO TABS
2.0000 | ORAL_TABLET | Freq: Once | ORAL | Status: AC
  Administered 2013-01-16: 2 via ORAL
  Filled 2013-01-16: qty 2

## 2013-01-16 MED ORDER — PANTOPRAZOLE SODIUM 40 MG PO TBEC
40.0000 mg | DELAYED_RELEASE_TABLET | Freq: Once | ORAL | Status: AC
  Administered 2013-01-16: 40 mg via ORAL
  Filled 2013-01-16: qty 1

## 2013-01-16 MED ORDER — KETOROLAC TROMETHAMINE 30 MG/ML IJ SOLN
15.0000 mg | Freq: Once | INTRAMUSCULAR | Status: AC
  Administered 2013-01-16: 15 mg via INTRAVENOUS
  Filled 2013-01-16: qty 1

## 2013-01-16 MED ORDER — OMEPRAZOLE 20 MG PO CPDR: 20.0000 mg | DELAYED_RELEASE_CAPSULE | Freq: Two times a day (BID) | ORAL | Status: DC

## 2013-01-16 MED ORDER — METOCLOPRAMIDE HCL 10 MG PO TABS: 10.0000 mg | ORAL_TABLET | Freq: Four times a day (QID) | ORAL | Status: DC

## 2013-01-16 MED ORDER — IOHEXOL 300 MG/ML  SOLN
50.0000 mL | Freq: Once | INTRAMUSCULAR | Status: AC | PRN
  Administered 2013-01-16: 50 mL via ORAL

## 2013-01-16 MED ORDER — HYDROCODONE-ACETAMINOPHEN 5-325 MG PO TABS: 1.0000 | ORAL_TABLET | Freq: Four times a day (QID) | ORAL | Status: DC | PRN

## 2013-01-16 MED ORDER — SUCRALFATE 1 G PO TABS: 1.0000 g | ORAL_TABLET | Freq: Four times a day (QID) | ORAL | Status: DC

## 2013-01-16 MED ORDER — IOHEXOL 300 MG/ML  SOLN
100.0000 mL | Freq: Once | INTRAMUSCULAR | Status: AC | PRN
  Administered 2013-01-16: 100 mL via INTRAVENOUS

## 2013-01-16 MED ORDER — METOCLOPRAMIDE HCL 5 MG/ML IJ SOLN
10.0000 mg | Freq: Once | INTRAMUSCULAR | Status: AC
  Administered 2013-01-16: 10 mg via INTRAVENOUS
  Filled 2013-01-16: qty 2

## 2013-01-16 MED ORDER — DIPHENHYDRAMINE HCL 50 MG/ML IJ SOLN
12.5000 mg | Freq: Once | INTRAMUSCULAR | Status: AC
  Administered 2013-01-16: 12.5 mg via INTRAVENOUS
  Filled 2013-01-16: qty 1

## 2013-01-16 NOTE — ED Notes (Signed)
Pt. Is not able to urinate this time.

## 2013-01-16 NOTE — ED Provider Notes (Signed)
History    51 six-year-old male with abdominal pain. There is a language. Most of the history is via an interpreter. Gradual onset of symptoms 3-4 days ago. Generally tired and fatigued. Denies any focal weakness. Patient is complaining of some mild upper abdominal pain, nausea and did vomit once the day before. No blood in his emesis. No diarrhea. No urinary complaints. Denies CP or shortness of breath. No sick contacts. No fever or chills. Little appetite and food doesn't taste good to him.   CSN: [id number]  Arrival date & time 01/14/13  1226   First MD Initiated Contact with Patient 01/14/13 1234      Chief Complaint  Patient presents with  . Weakness    (Consider location/radiation/quality/duration/timing/severity/associated sxs/prior treatment) HPI  Past Medical History  Diagnosis Date  . Diabetes mellitus without complication     Past Surgical History  Procedure Laterality Date  . Appendectomy      No family history on file.  History  Substance Use Topics  . Smoking status: Never Smoker   . Smokeless tobacco: Not on file  . Alcohol Use: No      Review of Systems  All systems reviewed and negative, other than as noted in HPI.   Allergies  Review of patient's allergies indicates no known allergies.  Home Medications   Current Outpatient Rx  Name  Route  Sig  Dispense  Refill  . gabapentin (NEURONTIN) 300 MG capsule   Oral   Take 900 mg by mouth 3 (three) times daily.          . insulin detemir (LEVEMIR) 100 UNIT/ML injection   Subcutaneous   Inject 58 Units into the skin daily.         Marland Kitchen lisinopril (PRINIVIL,ZESTRIL) 5 MG tablet   Oral   Take 2.5 mg by mouth daily.         . metformin (FORTAMET) 500 MG (OSM) 24 hr tablet   Oral   Take 1,000 mg by mouth 2 (two) times daily with a meal.         . pantoprazole (PROTONIX) 40 MG tablet   Oral   Take 40 mg by mouth daily.         Marland Kitchen pyridOXINE (VITAMIN B-6) 100 MG tablet   Oral   Take  100 mg by mouth daily.         Marland Kitchen HYDROcodone-acetaminophen (NORCO/VICODIN) 5-325 MG per tablet   Oral   Take 1-2 tablets by mouth every 6 (six) hours as needed for pain.   23 tablet   0   . metoCLOPramide (REGLAN) 10 MG tablet   Oral   Take 1 tablet (10 mg total) by mouth every 6 (six) hours. As needed for nausea or vomiting   30 tablet   0   . omeprazole (PRILOSEC) 20 MG capsule   Oral   Take 1 capsule (20 mg total) by mouth 2 (two) times daily.   60 capsule   0   . sucralfate (CARAFATE) 1 G tablet   Oral   Take 1 tablet (1 g total) by mouth 4 (four) times daily. 30 minutes before eating   60 tablet   0     BP 130/92  Pulse 93  Temp(Src) 98.5 F (36.9 C) (Oral)  Resp 20  SpO2 100%  Physical Exam  Nursing note and vitals reviewed. Constitutional: He appears well-developed and well-nourished. No distress.  HENT:  Head: Normocephalic and atraumatic.  Eyes: Conjunctivae are normal. Right  eye exhibits no discharge. Left eye exhibits no discharge.  Neck: Neck supple.  Cardiovascular: Normal rate, regular rhythm and normal heart sounds.  Exam reveals no gallop and no friction rub.   No murmur heard. Pulmonary/Chest: Breath sounds normal.  tachypneic around 30. Lungs clear.  Abdominal: Soft. He exhibits no distension. There is tenderness. There is no rebound and no guarding.  Mild epigastric tenderness w/o rebound ro guarding  Musculoskeletal: He exhibits no edema and no tenderness.  Lower extremities symmetric as compared to each other. No calf tenderness. Negative Homan's. No palpable cords.   Neurological: He is alert. No cranial nerve deficit. He exhibits normal muscle tone. Coordination normal.  Skin: Skin is warm and dry.  Psychiatric: His behavior is normal. Thought content normal.    ED Course  Procedures (including critical care time)  Labs Reviewed  CBC WITH DIFFERENTIAL - Abnormal; Notable for the following:    RBC 6.47 (*)    MCV 69.6 (*)    MCH  24.7 (*)    All other components within normal limits  BASIC METABOLIC PANEL - Abnormal; Notable for the following:    Glucose, Bld 116 (*)    GFR calc non Af Amer 80 (*)    All other components within normal limits  URINALYSIS, ROUTINE W REFLEX MICROSCOPIC - Abnormal; Notable for the following:    APPearance CLOUDY (*)    Ketones, ur 15 (*)    All other components within normal limits  CULTURE, BLOOD (ROUTINE X 2)  CULTURE, BLOOD (ROUTINE X 2)  GLUCOSE, CAPILLARY  LIPASE, BLOOD  D-DIMER, QUANTITATIVE  CG4 I-STAT (LACTIC ACID)   US Abdomen Complete  01/16/2013   *RADIOLOGY REPORT*  Clinical Data:  Lower quadrant abdominal pain.  COMPLETE ABDOMINAL ULTRASOUND  Comparison:  CT scan 01/14/2013.  Findings:  Gallbladder:  Echogenic sludge noted layering in the gallbladder. No gallbladder wall thickening, pericholecystic fluid or definite gallstones.  Common bile duct:  Normal in caliber measuring a maximum of 4.7mm.  Liver:  There is diffuse increased echogenicity of the liver and decreased through transmission consistent with fatty infiltration. No focal lesions or biliary dilatation.  IVC:  Normal caliber.  Pancreas:  Not well visualized due to overlying bowel gas but was normal on the recent CT scan.  Spleen:  Normal size and echogenicity without focal lesions.  Right Kidney:  10.0 cm in length. Normal renal cortical thickness and echogenicity without focal lesions or hydronephrosis.  Left Kidney:  10.8 cm in length. Normal renal cortical thickness and echogenicity without focal lesions or hydronephrosis.  Abdominal aorta:  Normal.  IMPRESSION:  1.  Layering echogenic sludge in the gallbladder but no definite gallstones or findings for acute cholecystitis. 2.  Normal caliber common bile duct. 3.  Diffuse fatty infiltration of the liver. 4.  Poor visualization of the pancreas.   Original Report Authenticated By: Rudie Meyer, M.D.   Ct Abdomen Pelvis W Contrast  01/16/2013   *RADIOLOGY REPORT*   Clinical Data: Epigastric pain.  CT ABDOMEN AND PELVIS WITH CONTRAST  Technique:  Multidetector CT imaging of the abdomen and pelvis was performed following the standard protocol during bolus administration of intravenous contrast.  Contrast: OMNIPAQUE IOHEXOL 300 MG/ML  SOLN  Comparison: 01/14/2013.  Findings: The lung bases are clear except for dependent atelectasis.  Mild diffuse fatty infiltration of the liver but no focal hepatic lesions or intrahepatic biliary dilatation.  Gallbladder is grossly normal.  No common bile duct dilatation.  The pancreas is normal.  The spleen is normal.  The adrenal glands and kidneys are normal.  The stomach, duodenum, small bowel and colon are unremarkable.  No inflammatory changes or mass lesions.  The appendix is surgically absent.  No mesenteric or retroperitoneal mass or adenopathy.  The aorta is normal in caliber.  The major branch vessels are patent.  The bladder is distended almost to the level of the umbilicus.  No pelvic mass, adenopathy or free pelvic fluid collections.  The prostate gland and seminal vesicles are unremarkable.  No inguinal mass or hernia.  The bony pelvis is intact.  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  IMPRESSION:  1.  Distended bladder almost to the level of the umbilicus. 2.  No other significant abdominal/pelvic findings demonstrated.   Original Report Authenticated By: Rudie Meyer, M.D.     1. Malaise and fatigue       MDM  58 six-year-old male with generalized malaise. Etiology is not completely clear. Low suspicion for emergent etiology. He did present with fairly significant tachypnea, but this has since improved with pain medications and fluids. Lungs are clear. Chest x-ray is clear as well. The rest of pulmonary embolus with a normal d-dimer. Does have some very mild epigastric tenderness. Possibly related to vomiting? CMP and lipase normal. CT abdomen pelvis is unremarkable as  well. Patient reports feeling much better prior to discharge. Return precautions discussed.       Raeford Razor, MD 01/16/13 1726

## 2013-01-16 NOTE — ED Provider Notes (Addendum)
History     CSN: [id number]  Arrival date & time 01/15/13  2301   First MD Initiated Contact with Patient 01/15/13 2333      Chief Complaint  Patient presents with  . Chest Pain   patient is not [place] speaking, family accompanying him translates  HPI Adam Horton is a 47 y.o. male presenting for the second time to the ER since Saturday, he did complain about fatigue and chest pain, chest pain started all last week however has gotten acutely worse tonight.  He points to the upper epigastrium, lower chest, says this is sharp, radiates to his back, complains about nausea, denies vomiting, lower abdominal pain, diarrhea, fevers, chills, shortness of breath. Patient has history of appendectomy. Patient has history of diabetes, no hypertension does not drink alcohol does not use tobacco.  Patient denies any hematochezia or melena but relates very light to chalky-colored stools for a week.   Past Medical History  Diagnosis Date  . Diabetes mellitus without complication     Past Surgical History  Procedure Laterality Date  . Appendectomy      History reviewed. No pertinent family history.  History  Substance Use Topics  . Smoking status: Never Smoker   . Smokeless tobacco: Not on file  . Alcohol Use: No      Review of Systems At least 10pt or greater review of systems completed and are negative except where specified in the HPI.  Allergies  Review of patient's allergies indicates no known allergies.  Home Medications   Current Outpatient Rx  Name  Route  Sig  Dispense  Refill  . gabapentin (NEURONTIN) 300 MG capsule   Oral   Take 900 mg by mouth 3 (three) times daily.          . insulin detemir (LEVEMIR) 100 UNIT/ML injection   Subcutaneous   Inject 58 Units into the skin daily.         Marland Kitchen lisinopril (PRINIVIL,ZESTRIL) 5 MG tablet   Oral   Take 2.5 mg by mouth daily.         . metformin (FORTAMET) 500 MG (OSM) 24 hr tablet   Oral   Take 1,000 mg by mouth 2  (two) times daily with a meal.         . pantoprazole (PROTONIX) 40 MG tablet   Oral   Take 40 mg by mouth daily.         Marland Kitchen pyridOXINE (VITAMIN B-6) 100 MG tablet   Oral   Take 100 mg by mouth daily.           BP 138/86  Pulse 78  Temp(Src) 98.1 F (36.7 C) (Oral)  Resp 38  SpO2 100%  Physical Exam  Nursing notes reviewed.  Electronic medical record reviewed. VITAL SIGNS:   Filed Vitals:   01/15/13 2311  BP: 138/86  Pulse: 78  Temp: 98.1 F (36.7 C)  TempSrc: Oral  Resp: 38  SpO2: 100%   CONSTITUTIONAL: Awake, oriented, appears in pain HENT: Atraumatic, normocephalic, oral mucosa pink and moist, airway patent. Nares patent without drainage. External ears normal. EYES: Conjunctiva clear, EOMI, PERRLA NECK: Trachea midline, non-tender, supple CARDIOVASCULAR: Normal heart rate, Normal rhythm, No murmurs, rubs, gallops PULMONARY/CHEST: Tachypneic. Clear to auscultation, no rhonchi, wheezes, or rales. Symmetrical breath sounds. Non-tender. ABDOMINAL: Non-distended, soft, tenderness to palpation in the epigastrium without rebound.  BS normal. NEUROLOGIC: Non-focal, moving all four extremities, no gross sensory or motor deficits. EXTREMITIES: No clubbing, cyanosis, or edema SKIN:  Warm, Dry, No erythema, No rash  ED Course  Procedures (including critical care time)  Date: 01/16/2013  Rate: 74  Rhythm: normal sinus rhythm  QRS Axis: normal  Intervals: normal  ST/T Wave abnormalities: ST elevation in V3 with J-point elevation  Conduction Disutrbances: none  Narrative Interpretation: CT findings suggestive of early repolarization, doubt acute coronary syndrome, second EKG shows a similar morphology.     Labs Reviewed  GLUCOSE, CAPILLARY - Abnormal; Notable for the following:    Glucose-Capillary 324 (*)    All other components within normal limits  CBC WITH DIFFERENTIAL - Abnormal; Notable for the following:    MCV 70.4 (*)    MCH 23.9 (*)    All other  components within normal limits  COMPREHENSIVE METABOLIC PANEL - Abnormal; Notable for the following:    Sodium 134 (*)    Glucose, Bld 285 (*)    AST 55 (*)    ALT 97 (*)    GFR calc non Af Amer 89 (*)    All other components within normal limits  LIPASE, BLOOD - Abnormal; Notable for the following:    Lipase 106 (*)    All other components within normal limits  URINALYSIS, ROUTINE W REFLEX MICROSCOPIC - Abnormal; Notable for the following:    Glucose, UA 500 (*)    All other components within normal limits  BILIRUBIN, DIRECT  TROPONIN I  HEPATITIS PANEL, ACUTE  POCT I-STAT TROPONIN I   US Abdomen Complete  01/16/2013   *RADIOLOGY REPORT*  Clinical Data:  Lower quadrant abdominal pain.  COMPLETE ABDOMINAL ULTRASOUND  Comparison:  CT scan 01/14/2013.  Findings:  Gallbladder:  Echogenic sludge noted layering in the gallbladder. No gallbladder wall thickening, pericholecystic fluid or definite gallstones.  Common bile duct:  Normal in caliber measuring a maximum of 4.74mm.  Liver:  There is diffuse increased echogenicity of the liver and decreased through transmission consistent with fatty infiltration. No focal lesions or biliary dilatation.  IVC:  Normal caliber.  Pancreas:  Not well visualized due to overlying bowel gas but was normal on the recent CT scan.  Spleen:  Normal size and echogenicity without focal lesions.  Right Kidney:  10.0 cm in length. Normal renal cortical thickness and echogenicity without focal lesions or hydronephrosis.  Left Kidney:  10.8 cm in length. Normal renal cortical thickness and echogenicity without focal lesions or hydronephrosis.  Abdominal aorta:  Normal.  IMPRESSION:  1.  Layering echogenic sludge in the gallbladder but no definite gallstones or findings for acute cholecystitis. 2.  Normal caliber common bile duct. 3.  Diffuse fatty infiltration of the liver. 4.  Poor visualization of the pancreas.   Original Report Authenticated By: Rudie Meyer, M.D.   Ct  Abdomen Pelvis W Contrast  01/16/2013   *RADIOLOGY REPORT*  Clinical Data: Epigastric pain.  CT ABDOMEN AND PELVIS WITH CONTRAST  Technique:  Multidetector CT imaging of the abdomen and pelvis was performed following the standard protocol during bolus administration of intravenous contrast.  Contrast: OMNIPAQUE IOHEXOL 300 MG/ML  SOLN  Comparison: 01/14/2013.  Findings: The lung bases are clear except for dependent atelectasis.  Mild diffuse fatty infiltration of the liver but no focal hepatic lesions or intrahepatic biliary dilatation.  Gallbladder is grossly normal.  No common bile duct dilatation.  The pancreas is normal. The spleen is normal.  The adrenal glands and kidneys are normal.  The stomach, duodenum, small bowel and colon are unremarkable.  No inflammatory changes or mass lesions.  The appendix is surgically absent.  No mesenteric or retroperitoneal mass or adenopathy.  The aorta is normal in caliber.  The major branch vessels are patent.  The bladder is distended almost to the level of the umbilicus.  No pelvic mass, adenopathy or free pelvic fluid collections.  The prostate gland and seminal vesicles are unremarkable.  No inguinal mass or hernia.  The bony pelvis is intact.  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  IMPRESSION:  1.  Distended bladder almost to the level of the umbilicus. 2.  No other significant abdominal/pelvic findings demonstrated.   Original Report Authenticated By: Rudie Meyer, M.D.   Ct Abdomen Pelvis W Contrast  01/14/2013   *RADIOLOGY REPORT*  Clinical Data: Chest pain.  Abdominal pain.  CT ABDOMEN AND PELVIS WITH CONTRAST  Technique:  Multidetector CT imaging of the abdomen and pelvis was performed following the standard protocol during bolus administration of intravenous contrast.  Contrast: OMNIPAQUE IOHEXOL 300 MG/ML  SOLN, 50mL OMNIPAQUE IOHEXOL 300 MG/ML  SOLN  Comparison: 09/19/2012  Findings: Diffuse hepatic  steatosis.  Gallbladder, spleen, pancreas, adrenal glands, kidneys are within normal limits.  Bladder is distended.  Unremarkable prostate.  No free fluid.  No abnormal adenopathy.  Post appendectomy clips.  Grade 1 L5-S1 spondylolisthesis.  IMPRESSION: No acute intra-abdominal or intrapelvic pathology.  Stable.   Original Report Authenticated By: Jolaine Click, M.D.   Dg Chest Portable 1 View  01/14/2013   *RADIOLOGY REPORT*  Clinical Data: Short of breath and cough  PORTABLE CHEST - 1 VIEW  Comparison: 11/21/2010  Findings: Heart is normal in size.  Lungs are clear.  No pleural effusion.  No pneumothorax.  No acute bony deformity.  IMPRESSION: No active cardiopulmonary disease.   Original Report Authenticated By: Jolaine Click, M.D.     1. Epigastric pain   2. Nausea       MDM  Initial EKG shows ST elevation in V2 and V3 with some J-point elevation suggestive of possible repolarization pattern, there are no reciprocal changes, serial EKG shows no change. Patient's pain appears to be more in the epigastrium, this is concerning with patient's history of chalky stools, nausea and pain radiating to the back. Bedside ultrasound of the patient's aorta shows a normal caliber aorta without aneurysm, this is not a complete evaluation is DID secure the middle portion of the aorta. However the patient's having most of his pain in the epigastrium, this portion of the aorta is of normal caliber.  Patient's LFTs are elevated, his lipase is slightly elevated at 102. Troponin is negative. Discussed initial EKG with cardiology Dr. Katha Cabal - degrees with early repolarization  No evidence for biliary obstruction, there is diffuse steatohepatoses with mild transaminitis, mild elevation in lipase I do not think this represents a pancreatitis as his CAT scan is unremarkable for any acute intra-abdominal process. Likewise cardiac workup is unremarkable, he has had negative EKG, 2 troponins which are negative.    On  further questioning and interview with the patient, he now describes his pain as a burning pain in the left upper quadrant, he says it feels like heartburn. Patient [place], he's had no previous history of parasites, schistosomiasis, malaria.  No recent travel.  Patient was given a GI cocktail, Reglan and Benadryl which improved his pain significantly.  Patient was initially mildly tachypneic however think this was due to discomfort, since treatment he has felt much better, vital  signs have normalized, noted tachycardia, no hypoxia. I feel like this patient is low risk for a pulmonary embolism, and it doesn't clinically correlate with his history. He is PERC negative.   Patient has a primary care physician in [place] however is moved to the [place] area. We'll [facility] the [facility] to establish primary care for his diabetes, we'll also refer him to GI is at sounds like by his history he may have a peptic ulcer. We'll treat with PPI, antiemetics, pain medicine. Also advised cessation of any and all NSAIDs.  I explained the diagnosis and have given explicit precautions to return to the ER including any other new or worsening symptoms. The patient understands and accepts the medical plan as it's been dictated and I have answered his questions. Discharge instructions concerning home care and prescriptions have been given.  The patient is STABLE and is discharged to home in good condition.       Jones Skene, MD 01/16/13 4782  Jones Skene, MD 01/16/13 9562

## 2013-01-17 ENCOUNTER — Telehealth (HOSPITAL_COMMUNITY): Payer: Self-pay | Admitting: Emergency Medicine

## 2013-01-17 NOTE — ED Notes (Signed)
+  HCV. Chart sent to EDP office for review.

## 2013-01-17 NOTE — ED Notes (Signed)
Patient has +Hepatitis.

## 2013-01-20 ENCOUNTER — Telehealth (HOSPITAL_COMMUNITY): Payer: Self-pay | Admitting: Emergency Medicine

## 2013-01-20 ENCOUNTER — Inpatient Hospital Stay (HOSPITAL_COMMUNITY): Payer: BC Managed Care – PPO

## 2013-01-20 ENCOUNTER — Inpatient Hospital Stay (HOSPITAL_COMMUNITY)
Admission: EM | Admit: 2013-01-20 | Discharge: 2013-01-28 | DRG: 182 | Disposition: A | Discharge status: Home / Self Care | Payer: BC Managed Care – PPO | Attending: Internal Medicine | Admitting: Internal Medicine

## 2013-01-20 ENCOUNTER — Encounter (HOSPITAL_COMMUNITY): Payer: Self-pay | Admitting: *Deleted

## 2013-01-20 ENCOUNTER — Emergency Department (HOSPITAL_COMMUNITY): Payer: BC Managed Care – PPO

## 2013-01-20 DIAGNOSIS — K2900 Acute gastritis without bleeding: Principal | ICD-10-CM | POA: Diagnosis present

## 2013-01-20 DIAGNOSIS — R1110 Vomiting, unspecified: Secondary | ICD-10-CM

## 2013-01-20 DIAGNOSIS — I10 Essential (primary) hypertension: Secondary | ICD-10-CM

## 2013-01-20 DIAGNOSIS — E119 Type 2 diabetes mellitus without complications: Secondary | ICD-10-CM

## 2013-01-20 DIAGNOSIS — R748 Abnormal levels of other serum enzymes: Secondary | ICD-10-CM

## 2013-01-20 DIAGNOSIS — Z8611 Personal history of tuberculosis: Secondary | ICD-10-CM

## 2013-01-20 DIAGNOSIS — R7989 Other specified abnormal findings of blood chemistry: Secondary | ICD-10-CM

## 2013-01-20 DIAGNOSIS — B1920 Unspecified viral hepatitis C without hepatic coma: Secondary | ICD-10-CM | POA: Diagnosis present

## 2013-01-20 DIAGNOSIS — K279 Peptic ulcer, site unspecified, unspecified as acute or chronic, without hemorrhage or perforation: Secondary | ICD-10-CM | POA: Diagnosis present

## 2013-01-20 DIAGNOSIS — R109 Unspecified abdominal pain: Secondary | ICD-10-CM

## 2013-01-20 DIAGNOSIS — R911 Solitary pulmonary nodule: Secondary | ICD-10-CM

## 2013-01-20 DIAGNOSIS — Z794 Long term (current) use of insulin: Secondary | ICD-10-CM

## 2013-01-20 DIAGNOSIS — J984 Other disorders of lung: Secondary | ICD-10-CM

## 2013-01-20 DIAGNOSIS — R932 Abnormal findings on diagnostic imaging of liver and biliary tract: Secondary | ICD-10-CM

## 2013-01-20 LAB — CBC WITH DIFFERENTIAL/PLATELET
Basophils Absolute: 0 10*3/uL (ref 0.0–0.1)
Eosinophils Absolute: 0 10*3/uL (ref 0.0–0.7)
Eosinophils Relative: 0 % (ref 0–5)
Lymphs Abs: 1 10*3/uL (ref 0.7–4.0)
MCH: 24.5 pg — ABNORMAL LOW (ref 26.0–34.0)
Monocytes Absolute: 0.1 10*3/uL (ref 0.1–1.0)
Neutrophils Relative %: 76 % (ref 43–77)
Platelets: 248 10*3/uL (ref 150–400)
RBC: 5.84 MIL/uL — ABNORMAL HIGH (ref 4.22–5.81)
RDW: 13.6 % (ref 11.5–15.5)

## 2013-01-20 LAB — URINALYSIS, ROUTINE W REFLEX MICROSCOPIC
Glucose, UA: >1000 mg/dL — AB
Hgb urine dipstick: NEGATIVE
Ketones, ur: NEGATIVE mg/dL
Protein, ur: NEGATIVE mg/dL

## 2013-01-20 LAB — URINE MICROSCOPIC-ADD ON

## 2013-01-20 LAB — CULTURE, BLOOD (ROUTINE X 2)
Culture: NO GROWTH
Culture: NO GROWTH

## 2013-01-20 LAB — COMPREHENSIVE METABOLIC PANEL
ALT: 79 U/L — ABNORMAL HIGH (ref 0–53)
AST: 35 U/L (ref 0–37)
CO2: 21 mEq/L (ref 19–32)
Chloride: 98 mEq/L (ref 96–112)
Creatinine, Ser: 1.04 mg/dL (ref 0.50–1.35)
GFR calc Af Amer: >90 mL/min (ref >90)
GFR calc non Af Amer: 84 mL/min — ABNORMAL LOW (ref >90)
Glucose, Bld: 254 mg/dL — ABNORMAL HIGH (ref 70–99)
Sodium: 131 mEq/L — ABNORMAL LOW (ref 135–145)
Total Bilirubin: 0.5 mg/dL (ref 0.3–1.2)

## 2013-01-20 LAB — MRSA PCR SCREENING: MRSA by PCR: NEGATIVE

## 2013-01-20 LAB — GLUCOSE, CAPILLARY: Glucose-Capillary: 127 mg/dL — ABNORMAL HIGH (ref 70–99)

## 2013-01-20 LAB — TROPONIN I: Troponin I: <0.3 ng/mL (ref <0.30)

## 2013-01-20 LAB — ETHANOL: Alcohol, Ethyl (B): <11 mg/dL (ref 0–11)

## 2013-01-20 MED ORDER — IOHEXOL 350 MG/ML SOLN
100.0000 mL | Freq: Once | INTRAVENOUS | Status: AC | PRN
  Administered 2013-01-20: 100 mL via INTRAVENOUS

## 2013-01-20 MED ORDER — INSULIN ASPART 100 UNIT/ML ~~LOC~~ SOLN
0.0000 [IU] | Freq: Three times a day (TID) | SUBCUTANEOUS | Status: DC
  Administered 2013-01-22 (×2): 2 [IU] via SUBCUTANEOUS

## 2013-01-20 MED ORDER — HYDROMORPHONE HCL PF 1 MG/ML IJ SOLN
1.0000 mg | Freq: Once | INTRAMUSCULAR | Status: AC
  Administered 2013-01-20: 1 mg via INTRAVENOUS
  Filled 2013-01-20: qty 1

## 2013-01-20 MED ORDER — DIPHENHYDRAMINE HCL 50 MG/ML IJ SOLN
INTRAMUSCULAR | Status: AC
  Filled 2013-01-20: qty 1

## 2013-01-20 MED ORDER — GI COCKTAIL ~~LOC~~
30.0000 mL | Freq: Three times a day (TID) | ORAL | Status: DC | PRN
  Administered 2013-01-23 – 2013-01-27 (×6): 30 mL via ORAL
  Filled 2013-01-20 (×9): qty 30

## 2013-01-20 MED ORDER — FAMOTIDINE IN NACL 20-0.9 MG/50ML-% IV SOLN
20.0000 mg | Freq: Once | INTRAVENOUS | Status: AC
  Administered 2013-01-20: 20 mg via INTRAVENOUS
  Filled 2013-01-20: qty 50

## 2013-01-20 MED ORDER — ONDANSETRON HCL 4 MG/2ML IJ SOLN
4.0000 mg | Freq: Once | INTRAMUSCULAR | Status: AC
  Administered 2013-01-20: 4 mg via INTRAVENOUS
  Filled 2013-01-20: qty 2

## 2013-01-20 MED ORDER — ONDANSETRON HCL 4 MG/2ML IJ SOLN
4.0000 mg | Freq: Four times a day (QID) | INTRAMUSCULAR | Status: DC | PRN
  Administered 2013-01-20 – 2013-01-21 (×3): 4 mg via INTRAVENOUS
  Filled 2013-01-20 (×3): qty 2

## 2013-01-20 MED ORDER — SODIUM CHLORIDE 0.9 % IV SOLN
INTRAVENOUS | Status: DC
  Administered 2013-01-20: 18:00:00 via INTRAVENOUS

## 2013-01-20 MED ORDER — ACETAMINOPHEN 650 MG RE SUPP: 650.0000 mg | Freq: Four times a day (QID) | RECTAL | Status: DC | PRN

## 2013-01-20 MED ORDER — ALBUTEROL SULFATE (5 MG/ML) 0.5% IN NEBU: 2.5000 mg | INHALATION_SOLUTION | RESPIRATORY_TRACT | Status: DC | PRN

## 2013-01-20 MED ORDER — ONDANSETRON HCL 4 MG PO TABS: 4.0000 mg | ORAL_TABLET | Freq: Four times a day (QID) | ORAL | Status: DC | PRN

## 2013-01-20 MED ORDER — HYDROMORPHONE HCL PF 1 MG/ML IJ SOLN
1.0000 mg | Freq: Once | INTRAMUSCULAR | Status: AC
  Administered 2013-01-20: 1 mg via INTRAVENOUS

## 2013-01-20 MED ORDER — DIPHENHYDRAMINE HCL 50 MG/ML IJ SOLN
12.5000 mg | Freq: Three times a day (TID) | INTRAMUSCULAR | Status: DC | PRN
  Administered 2013-01-20 – 2013-01-22 (×5): 12.5 mg via INTRAVENOUS
  Filled 2013-01-20 (×4): qty 1

## 2013-01-20 MED ORDER — PANTOPRAZOLE SODIUM 40 MG IV SOLR
40.0000 mg | Freq: Two times a day (BID) | INTRAVENOUS | Status: DC
  Administered 2013-01-20 – 2013-01-23 (×7): 40 mg via INTRAVENOUS
  Filled 2013-01-20 (×10): qty 40

## 2013-01-20 MED ORDER — ACETAMINOPHEN 325 MG PO TABS: 650.0000 mg | ORAL_TABLET | Freq: Four times a day (QID) | ORAL | Status: DC | PRN

## 2013-01-20 MED ORDER — SODIUM CHLORIDE 0.9 % IV BOLUS (SEPSIS)
1000.0000 mL | Freq: Once | INTRAVENOUS | Status: AC
  Administered 2013-01-20: 1000 mL via INTRAVENOUS

## 2013-01-20 MED ORDER — SODIUM CHLORIDE 0.9 % IJ SOLN
3.0000 mL | Freq: Two times a day (BID) | INTRAMUSCULAR | Status: DC
  Administered 2013-01-21 – 2013-01-28 (×10): 3 mL via INTRAVENOUS

## 2013-01-20 MED ORDER — GI COCKTAIL ~~LOC~~
30.0000 mL | Freq: Once | ORAL | Status: AC
  Administered 2013-01-20: 30 mL via ORAL
  Filled 2013-01-20: qty 30

## 2013-01-20 MED ORDER — SODIUM CHLORIDE 0.9 % IV SOLN
INTRAVENOUS | Status: DC
  Administered 2013-01-20 – 2013-01-22 (×5): via INTRAVENOUS

## 2013-01-20 MED ORDER — HYDROMORPHONE HCL PF 1 MG/ML IJ SOLN
1.0000 mg | INTRAMUSCULAR | Status: DC | PRN
  Administered 2013-01-20 – 2013-01-21 (×5): 1 mg via INTRAVENOUS
  Filled 2013-01-20 (×5): qty 1

## 2013-01-20 NOTE — ED Provider Notes (Signed)
History     CSN: [id number]  Arrival date & time 01/20/13  1256   First MD Initiated Contact with Patient 01/20/13 1330      Chief Complaint  Patient presents with  . Chest Pain  . Abdominal Pain    (Consider location/radiation/quality/duration/timing/severity/associated sxs/prior treatment) HPI Comments: Patient presents with complaint of continued chest pain and abdominal pain. Patient was seen in emergency department twice in the past week for the same symptoms. He had positive hep C antibody, negative CT, negative abdominal ultrasound. When asked where the pain is patient points from his neck to the lower portion of his abdomen. It is described as squeezing. It does seem to be worse in the upper abdomen and epigastrium. It is associated with nausea and vomiting. Denies alcohol, drug, heavy NSAID use. No fever. No change in urination or pain with urination. Patient has a history of stomach ulcers. The onset of this condition was acute. The course is constant. Aggravating factors: palpation. Alleviating factors: none.    Patient is a 47 y.o. male presenting with chest pain and abdominal pain. The history is provided by the patient.  Chest Pain Associated symptoms: abdominal pain, nausea and vomiting   Associated symptoms: no cough, no fever, no headache and no palpitations   Abdominal Pain Associated symptoms include abdominal pain, chest pain, nausea and vomiting. Pertinent negatives include no coughing, fever, headaches, myalgias, rash or sore throat.    Past Medical History  Diagnosis Date  . Diabetes mellitus without complication     Past Surgical History  Procedure Laterality Date  . Appendectomy      No family history on file.  History  Substance Use Topics  . Smoking status: Never Smoker   . Smokeless tobacco: Not on file  . Alcohol Use: No      Review of Systems  Constitutional: Negative for fever.  HENT: Negative for sore throat and rhinorrhea.   Eyes:  Negative for redness.  Respiratory: Negative for cough.   Cardiovascular: Positive for chest pain. Negative for palpitations and leg swelling.  Gastrointestinal: Positive for nausea, vomiting and abdominal pain. Negative for diarrhea and blood in stool.  Genitourinary: Negative for dysuria.  Musculoskeletal: Negative for myalgias.  Skin: Negative for rash.  Neurological: Negative for headaches.    Allergies  Review of patient's allergies indicates no known allergies.  Home Medications   Current Outpatient Rx  Name  Route  Sig  Dispense  Refill  . gabapentin (NEURONTIN) 300 MG capsule   Oral   Take 900 mg by mouth 3 (three) times daily.          Marland Kitchen HYDROcodone-acetaminophen (NORCO/VICODIN) 5-325 MG per tablet   Oral   Take 1-2 tablets by mouth every 6 (six) hours as needed for pain.   23 tablet   0   . insulin detemir (LEVEMIR) 100 UNIT/ML injection   Subcutaneous   Inject 58 Units into the skin daily.         Marland Kitchen lisinopril (PRINIVIL,ZESTRIL) 5 MG tablet   Oral   Take 2.5 mg by mouth daily.         . metformin (FORTAMET) 500 MG (OSM) 24 hr tablet   Oral   Take 1,000 mg by mouth 2 (two) times daily with a meal.         . metoCLOPramide (REGLAN) 10 MG tablet   Oral   Take 1 tablet (10 mg total) by mouth every 6 (six) hours. As needed for nausea or vomiting  30 tablet   0   . omeprazole (PRILOSEC) 20 MG capsule   Oral   Take 1 capsule (20 mg total) by mouth 2 (two) times daily.   60 capsule   0   . pantoprazole (PROTONIX) 40 MG tablet   Oral   Take 40 mg by mouth daily.         Marland Kitchen pyridOXINE (VITAMIN B-6) 100 MG tablet   Oral   Take 100 mg by mouth daily.         . sucralfate (CARAFATE) 1 G tablet   Oral   Take 1 tablet (1 g total) by mouth 4 (four) times daily. 30 minutes before eating   60 tablet   0     BP 110/78  Pulse 76  Temp(Src) 98.2 F (36.8 C) (Oral)  Resp 32  SpO2 100%  Physical Exam  Nursing note and vitals  reviewed. Constitutional: He appears well-developed and well-nourished.  HENT:  Head: Normocephalic and atraumatic.  Eyes: Conjunctivae are normal. Right eye exhibits no discharge. Left eye exhibits no discharge.  Neck: Normal range of motion. Neck supple.  Cardiovascular: Normal rate, regular rhythm and normal heart sounds.   Pulmonary/Chest: Effort normal and breath sounds normal.  Abdominal: Soft. Bowel sounds are decreased. There is generalized tenderness. There is no rigidity, no rebound, no guarding, no tenderness at McBurney's point and negative Murphy's sign.    Neurological: He is alert.  Skin: Skin is warm and dry.  Psychiatric: He has a normal mood and affect.    ED Course  Procedures (including critical care time)  Labs Reviewed  CBC WITH DIFFERENTIAL - Abnormal; Notable for the following:    RBC 5.84 (*)    MCV 71.2 (*)    MCH 24.5 (*)    Monocytes Relative 2 (*)    All other components within normal limits  COMPREHENSIVE METABOLIC PANEL - Abnormal; Notable for the following:    Sodium 131 (*)    Potassium 5.2 (*)    Glucose, Bld 254 (*)    ALT 79 (*)    GFR calc non Af Amer 84 (*)    All other components within normal limits  LIPASE, BLOOD - Abnormal; Notable for the following:    Lipase 82 (*)    All other components within normal limits  CG4 I-STAT (LACTIC ACID) - Abnormal; Notable for the following:    Lactic Acid, Venous 2.95 (*)    All other components within normal limits  URINALYSIS, ROUTINE W REFLEX MICROSCOPIC  POCT I-STAT TROPONIN I   Dg Abd Acute W/chest  01/20/2013   *RADIOLOGY REPORT*  Clinical Data: Abdominal pain  ACUTE ABDOMEN SERIES (ABDOMEN 2 VIEW & CHEST 1 VIEW)  Comparison: CT abdomen pelvis dated 01/16/2013  Findings: Lungs are essentially clear.  No focal consolidation.  No pleural effusion or pneumothorax.  Nonobstructive bowel gas pattern.  No evidence of free air under the diaphragm on the upright view.  Visualized osseous structures  are within normal limits.  IMPRESSION: No evidence of acute cardiopulmonary disease.  No evidence of small bowel obstruction or free air.   Original Report Authenticated By: Charline Bills, M.D.     1. Abdominal pain   2. Elevated lipase   3. Vomiting     1:44 PM Patient seen and examined. Work-up initiated. Medications ordered. Previous labs reviewed.   Vital signs reviewed and are as follows: Filed Vitals:   01/20/13 1307  BP:   Pulse:   Temp: 98.2 F (  36.8 C)  Resp:   BP 110/78  Pulse 76  Temp(Src) 98.2 F (36.8 C) (Oral)  Resp 32  SpO2 100%   Date: 01/20/2013  Rate: 76  Rhythm: normal sinus rhythm  QRS Axis: normal  Intervals: normal  ST/T Wave abnormalities: early repolarization  Conduction Disutrbances:none  Narrative Interpretation:   Old EKG Reviewed: unchanged from 01/15/13  Patient discussed with and seen by Dr. Bernette Mayers.   4:26 PM Patient was improved but now worsening to 8/10. Patient appears uncomfortable. I am not confident that we can control symptoms. I discussed this with the patient. He is reluctant, but agrees with admission to hospital for further evaluation.   4:50 PM Spoke with [facility] who will see and admit for further work-up, pain control.    MDM  Admit for abdominal pain of unclear etiology, failure of outpatient therapy.         Renne Crigler, PA-C 01/20/13 1650

## 2013-01-20 NOTE — ED Notes (Signed)
Pt remains in CT at this time.  

## 2013-01-20 NOTE — ED Notes (Signed)
Attempted report to [phone] 

## 2013-01-20 NOTE — ED Notes (Signed)
Patient in [facility] ED on 5/23. Notified Sheldon MD of +Hepatitis C result.

## 2013-01-20 NOTE — ED Notes (Addendum)
Per EMS- pt PCP in [place] called to follow up with pt today he told them that he had abdominal and chest pain/ PCP office called EMS. Family states that he has ulcers. Pt was given zofran en route for nausea. Pt reports "squeezing" pain in abdomen and up to chest and throat. Pt states that he has chest, back, leg pain and headache

## 2013-01-20 NOTE — ED Notes (Signed)
Scanned pts bladder with bladder scanner, read 

## 2013-01-20 NOTE — ED Notes (Signed)
Attempted report 

## 2013-01-20 NOTE — Consult Note (Signed)
[facility] Consultation  Referring Provider: No ref. provider found Primary Care Physician:  No PCP Per Patient Primary Gastroenterologist:  none.  Reason for Consultation:  Upper abdominal pain, severe, this is the third visit to ED since 08/2012  HPI:    Adam Horton is a 47 y.o.[place] male, with constant and persistent LUQ and epigastric pain at least since Jan 2014,,this time he is admitted.for further evaluation. The pain is associated with nausea and vomiting. There is a language barrier, so complete history is not possible. But he denies fever although he has shivers, Blood tests show mild elevation on 2 occassions, also elevated LFT's and positive Hep C antibody.CT angio tonight shows lung lesion and liver lesions suggestive of TB or metastatic disease.He has been on Naproxen 500mg  for abd pain given to him in ED.     Component Value Date/Time   SDES BLOOD LEFT HAND 01/14/2013 1331   SPECREQUEST BOTTLES DRAWN AEROBIC ONLY 3CC 01/14/2013 1331   CULT NO GROWTH 5 DAYS 01/14/2013 1331   REPTSTATUS 01/20/2013 FINAL 01/14/2013 1331     Past Medical History  Diagnosis Date  . Diabetes mellitus without complication     Past Surgical History  Procedure Laterality Date  . Appendectomy      Prior to Admission medications   Medication Sig Start Date End Date Taking? Authorizing Provider  insulin detemir (LEVEMIR) 100 UNIT/ML injection Inject 58 Units into the skin daily.   Yes Historical Provider, MD  lisinopril (PRINIVIL,ZESTRIL) 5 MG tablet Take 2.5 mg by mouth daily.   Yes Historical Provider, MD  metformin (FORTAMET) 500 MG (OSM) 24 hr tablet Take 1,000 mg by mouth 2 (two) times daily with a meal.   Yes Historical Provider, MD  metoCLOPramide (REGLAN) 10 MG tablet Take 1 tablet (10 mg total) by mouth every 6 (six) hours. As needed for nausea or vomiting 01/16/13  Yes John-Adam Bonk, MD  naproxen (NAPROSYN) 500 MG tablet Take 500 mg by mouth 2 (two) times daily as  needed (pain).    Yes Historical Provider, MD  omeprazole (PRILOSEC) 20 MG capsule Take 20 mg by mouth daily as needed (for stomach pain).   Yes Historical Provider, MD  OVER THE COUNTER MEDICATION Place 1 drop into the left eye 2 (two) times daily as needed (otc eye drop prn pain).   Yes Historical Provider, MD  pantoprazole (PROTONIX) 40 MG tablet Take 40 mg by mouth daily.   Yes Historical Provider, MD  pyridOXINE (VITAMIN B-6) 100 MG tablet Take 100 mg by mouth daily.   Yes Historical Provider, MD  sucralfate (CARAFATE) 1 G tablet Take 1 tablet (1 g total) by mouth 4 (four) times daily. 30 minutes before eating 01/16/13  Yes John-Adam Bonk, MD    Current Facility-Administered Medications  Medication Dose Route Frequency Provider Last Rate Last Dose  . 0.9 %  sodium chloride infusion   Intravenous Continuous Maretta Bees, MD 150 mL/hr at 01/20/13 2010    . acetaminophen (TYLENOL) tablet 650 mg  650 mg Oral Q6H PRN Shanker Levora Dredge, MD       Or  . acetaminophen (TYLENOL) suppository 650 mg  650 mg Rectal Q6H PRN Shanker Levora Dredge, MD      . albuterol (PROVENTIL) (5 MG/ML) 0.5% nebulizer solution 2.5 mg  2.5 mg Nebulization Q2H PRN Shanker Levora Dredge, MD      . diphenhydrAMINE (BENADRYL) 50 MG/ML injection           . diphenhydrAMINE (BENADRYL) injection  12.5 mg  12.5 mg Intravenous Q8H PRN Jinger Neighbors, NP   12.5 mg at 01/20/13 2124  . gi cocktail (Maalox,Lidocaine,Donnatal)  30 mL Oral TID PRN Maretta Bees, MD      . HYDROmorphone (DILAUDID) injection 1 mg  1 mg Intravenous Q4H PRN Maretta Bees, MD   1 mg at 01/20/13 2034  . [START ON 01/21/2013] insulin aspart (novoLOG) injection 0-9 Units  0-9 Units Subcutaneous TID WC Shanker Levora Dredge, MD      . ondansetron [facility]) tablet 4 mg  4 mg Oral Q6H PRN Maretta Bees, MD       Or  . ondansetron [facility]) injection 4 mg  4 mg Intravenous Q6H PRN Maretta Bees, MD   4 mg at 01/20/13 2034  . pantoprazole (PROTONIX) injection  40 mg  40 mg Intravenous Q12H Maretta Bees, MD   40 mg at 01/20/13 1817  . sodium chloride 0.9 % injection 3 mL  3 mL Intravenous Q12H Maretta Bees, MD        Allergies as of 01/20/2013  . (No Known Allergies)    No family history on file.  History   Social History  . Marital Status: Single    Spouse Name: N/A    Number of Children: N/A  . Years of Education: N/A   Occupational History  . Not on file.   Social History Main Topics  . Smoking status: Never Smoker   . Smokeless tobacco: Not on file  . Alcohol Use: No  . Drug Use: No  . Sexually Active: Not on file   Other Topics Concern  . Not on file   Social History Narrative  . No narrative on file    Review of Systems:denies dysphagia, positive for heartburn, N&V All other systems reviewed and negative except where noted in HPI.    Physical Exam:   Vital signs in last 24 hours: Temp:  [98.2 F (36.8 C)-98.4 F (36.9 C)] 98.3 F (36.8 C) (05/23 2005) Pulse Rate:  [73-85] 85 (05/23 2005) Resp:  [16-58] 24 (05/23 2005) BP: (105-168)/(72-95) 168/92 mmHg (05/23 2005) SpO2:  [97 %-100 %] 100 % (05/23 2005) Weight:  [133 lb 9.6 oz (60.6 kg)] 133 lb 9.6 oz (60.6 kg) (05/23 2000)   General:   Alert,  Small stature, well-nourished, pleasant and cooperative in NAD barely speaks [place], appears uncomfortable Head:  Normocephalic and atraumatic. Eyes:  Sclera clear, no icterus.   Conjunctiva pink. Ears:  Normal auditory acuity. Nose:  No deformity, discharge,  or lesions. Mouth:  No deformity or lesions.  Oropharynx pink & moist. Neck:  Supple; no masses or thyromegaly. Lungs:  Clear throughout to auscultation.   No wheezes, crackles, or rhonchi. No acute distress. Heart:  Regular rate and rhythm; no murmurs, clicks, rubs,  or gallops. Abdomen:  Soft but markedly tender, more so in LUQ, epigastrium and left flank, good BS's, liver edge at CM,no ascites.   Rectal:  Not done  Msk:  Symmetrical without gross  deformities. Pulses:  Normal pulses noted. Extremities:  Without clubbing or edema. Neurologic:  Alert and  oriented x4;  grossly normal neurologically. Skin:  Intact without significant lesions or rashes. Cervical Nodes:  No significant cervical adenopathy. Psych:  Alert and cooperative. In pain  Intake/Output from previous day:   Intake/Output this shift:    Lab Results:  Recent Labs  01/20/13 1409  WBC 4.6  HGB 14.3  HCT 41.6  PLT 248  BMET  Recent Labs  01/20/13 1409  NA 131*  K 5.2*  CL 98  CO2 21  GLUCOSE 254*  BUN 10  CREATININE 1.04  CALCIUM 9.7   LFT  Recent Labs  01/20/13 1409  PROT 8.2  ALBUMIN 4.3  AST 35  ALT 79*  ALKPHOS 82  BILITOT 0.5   PT/INR No results found for this basename: LABPROT, INR,  in the last 72 hours Hepatitis Panel No results found for this basename: HEPBSAG, HCVAB, HEPAIGM, HEPBIGM,  in the last 72 hours  Studies/Results: Ct Angio Chest Pe W/cm &/or Wo Cm  01/20/2013   *RADIOLOGY REPORT*  Clinical Data:  Chest pain.  Abdominal pain.  CT ANGIOGRAPHY CHEST, ABDOMEN AND PELVIS  Technique:  Multidetector CT imaging through the chest, abdomen and pelvis was performed using the standard protocol during bolus administration of intravenous contrast.  Multiplanar reconstructed images including MIPs were obtained and reviewed to evaluate the vascular anatomy.  Contrast: OMNIPAQUE IOHEXOL 350 MG/ML SOLN  Comparison:  01/16/2013  CTA CHEST  Findings:  There is no evidence of aortic aneurysm, aortic dissection, or aortic transection.  Innominate artery, right subclavian artery, right common carotid artery, left common carotid artery, left subclavian artery, and bilateral vertebral arteries are patent.  No pericardial effusion.  No abnormal adenopathy by measurement criteria.  No obvious filling defect in the pulmonary arterial tree to suggest acute pulmonary thromboembolism.  There is mild wall thickening of the distal esophagus just  above the GE junction.  No pneumothorax.  No pleural effusion.  Pleural-based 2.2 x 1.2 cm mass at the right apex associated with surrounding micronodules.  There are other irregular, triangular, and patchy opacities within the right upper lobe. 8 mm spiculated opacity in the left upper lobe towards the apex on image 37.  Dependent atelectasis at the lung bases.  No acute bony deformity.   Review of the MIP images confirms the above findings.  IMPRESSION: No evidence of aortic dissection.  Bilateral apical lung masses.  Primary considerations include malignancy and TB.  Small nodules and irregular opacities within the right upper lobe support the possibility of mycobacterium infection.  Correlate clinically as for the need for workup.  CTA ABDOMEN AND PELVIS  Findings:  No evidence of aortic dissection or aortic aneurysm.  Celiac, SMA, and IMA are patent.  Branch vessels are patent. Single renal arteries are patent.  Bilateral internal, external, and common iliac arteries are patent.  Multiple small foci of arterial enhancement within the liver are present.  Given history of hepatitis C, hepatic cellular carcinoma is not excluded.  Gallbladder, pancreas, spleen, adrenal glands, kidneys are stable in appearance.  Appendix is not visualized.  Bladder is within normal limits.  No free fluid.  No obvious abnormal adenopathy.  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  Bilateral L5 nerve root encroachment at this level.   Review of the MIP images confirms the above findings.  IMPRESSION: No evidence of aortic dissection or acute arterial abnormality.  Multiple foci of arterial enhancement throughout the liver.  Given history of hepatitis C, hepatic cellular carcinoma is not excluded. MRI is recommended to further characterize.  Otherwise stable exam.   Original Report Authenticated By: Jolaine Click, M.D.   Dg Abd Acute W/chest  01/20/2013   *RADIOLOGY REPORT*  Clinical Data: Abdominal pain  ACUTE ABDOMEN  SERIES (ABDOMEN 2 VIEW & CHEST 1 VIEW)  Comparison: CT abdomen pelvis dated 01/16/2013  Findings: Lungs are essentially clear.  No focal consolidation.  No pleural effusion or pneumothorax.  Nonobstructive bowel gas pattern.  No evidence of free air under the diaphragm on the upright view.  Visualized osseous structures are within normal limits.  IMPRESSION: No evidence of acute cardiopulmonary disease.  No evidence of small bowel obstruction or free air.   Original Report Authenticated By: Charline Bills, M.D.   Ct Angio Abd/pel W/ And/or W/o  01/20/2013   *RADIOLOGY REPORT*  Clinical Data:  Chest pain.  Abdominal pain.  CT ANGIOGRAPHY CHEST, ABDOMEN AND PELVIS  Technique:  Multidetector CT imaging through the chest, abdomen and pelvis was performed using the standard protocol during bolus administration of intravenous contrast.  Multiplanar reconstructed images including MIPs were obtained and reviewed to evaluate the vascular anatomy.  Contrast: OMNIPAQUE IOHEXOL 350 MG/ML SOLN  Comparison:  01/16/2013  CTA CHEST  Findings:  There is no evidence of aortic aneurysm, aortic dissection, or aortic transection.  Innominate artery, right subclavian artery, right common carotid artery, left common carotid artery, left subclavian artery, and bilateral vertebral arteries are patent.  No pericardial effusion.  No abnormal adenopathy by measurement criteria.  No obvious filling defect in the pulmonary arterial tree to suggest acute pulmonary thromboembolism.  There is mild wall thickening of the distal esophagus just above the GE junction.  No pneumothorax.  No pleural effusion.  Pleural-based 2.2 x 1.2 cm mass at the right apex associated with surrounding micronodules.  There are other irregular, triangular, and patchy opacities within the right upper lobe. 8 mm spiculated opacity in the left upper lobe towards the apex on image 37.  Dependent atelectasis at the lung bases.  No acute bony deformity.   Review of  the MIP images confirms the above findings.  IMPRESSION: No evidence of aortic dissection.  Bilateral apical lung masses.  Primary considerations include malignancy and TB.  Small nodules and irregular opacities within the right upper lobe support the possibility of mycobacterium infection.  Correlate clinically as for the need for workup.  CTA ABDOMEN AND PELVIS  Findings:  No evidence of aortic dissection or aortic aneurysm.  Celiac, SMA, and IMA are patent.  Branch vessels are patent. Single renal arteries are patent.  Bilateral internal, external, and common iliac arteries are patent.  Multiple small foci of arterial enhancement within the liver are present.  Given history of hepatitis C, hepatic cellular carcinoma is not excluded.  Gallbladder, pancreas, spleen, adrenal glands, kidneys are stable in appearance.  Appendix is not visualized.  Bladder is within normal limits.  No free fluid.  No obvious abnormal adenopathy.  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  Bilateral L5 nerve root encroachment at this level.   Review of the MIP images confirms the above findings.  IMPRESSION: No evidence of aortic dissection or acute arterial abnormality.  Multiple foci of arterial enhancement throughout the liver.  Given history of hepatitis C, hepatic cellular carcinoma is not excluded. MRI is recommended to further characterize.  Otherwise stable exam.   Original Report Authenticated By: Jolaine Click, M.D.        Impression / Plan:   Persistent, now chronic upper abdominal pain suggestive  of pancreatitis by it is constant nature and radiates to the back, also   Lipase was elevated..CT scan does not support the diagnosis of pancreatitis but does not rule it out. Peptic ulcer disease is another possibility although the pain  is atypical for an ulcer. ?TB peritonitis? Pt will have to be evaluated for  miliary TB and for hepatocellular carcinoma. Liver biopsy may be necessary to assess liver disease and  to r/o granulomatous hepatitis.. We ordered AFP and Quantiferon. We will go ahead with EGD in am, biopsies and AFast stains of gastric acid..    LOS: 0 days   Lina Sar  01/20/2013, 11:27 PM

## 2013-01-20 NOTE — H&P (Signed)
PATIENT DETAILS Name: Adam Horton Age: 47 y.o. Sex: male Date of Birth: 08-Jan-1966 Admit Date: 01/20/2013 PCP:No PCP Per Patient   CHIEF COMPLAINT:  Abdominal and Chest pain since last Thursday  HPI: Adam Horton is a 47 y.o. male with a Past Medical History of DM, HTN, ?Hep C who presents today with the above noted complaint. Please note patient is a poor historian, history was obtained via translator service on the phone. Even with translator service, history is still very poor. Patient for the past one week or so, has had persistent and constant abdominal pain and chest pain. When asked to clarify which hurts the most, he points to his epigastric area, then he will to his left pelvic area as well. He claims the pain is also in his chest in times he feels in his neck as well. He describes the pain as a combination of burning and squeezing sensation. He rates it at 10/10 in intensity. It is associated with numerous episodes of retching, and vomiting as well. The vomitus is nonbloody. He claims that food at times makes his pain worse, there is no particular relieving factors. He was here in the emergency room for the exact same complaint on May 17 and was discharged home, he was also here on May 19 and was also discharged home. He returns to the emergency room today with excruciating pain. During his past 2 visits, he has had 2 CT scans of his abdomen and a abdominal ultrasound that were unrevealing. He claims that he was discharged with a PPI, sucralfate, Norco, Reglan which she has been taking with no relief. Off note, in December of 2013, he was living in [place], where he had lower abdominal pain, and needed hernia repair. He today claims that his current pain is different than his pain that he had in December 2013. He claimed that he moved to [place] to be close to his family. He has no history of fever or headaches. His last bowel movement was today.  ALLERGIES:  No Known  Allergies  PAST MEDICAL HISTORY: Past Medical History  Diagnosis Date  . Diabetes mellitus without complication     PAST SURGICAL HISTORY: Past Surgical History  Procedure Laterality Date  . Appendectomy      MEDICATIONS AT HOME: Prior to Admission medications   Medication Sig Start Date End Date Taking? Authorizing Provider  insulin detemir (LEVEMIR) 100 UNIT/ML injection Inject 58 Units into the skin daily.   Yes Historical Provider, MD  lisinopril (PRINIVIL,ZESTRIL) 5 MG tablet Take 2.5 mg by mouth daily.   Yes Historical Provider, MD  metformin (FORTAMET) 500 MG (OSM) 24 hr tablet Take 1,000 mg by mouth 2 (two) times daily with a meal.   Yes Historical Provider, MD  metoCLOPramide (REGLAN) 10 MG tablet Take 1 tablet (10 mg total) by mouth every 6 (six) hours. As needed for nausea or vomiting 01/16/13  Yes John-Adam Bonk, MD  naproxen (NAPROSYN) 500 MG tablet Take 500 mg by mouth 2 (two) times daily as needed (pain).    Yes Historical Provider, MD  omeprazole (PRILOSEC) 20 MG capsule Take 20 mg by mouth daily as needed (for stomach pain).   Yes Historical Provider, MD  OVER THE COUNTER MEDICATION Place 1 drop into the left eye 2 (two) times daily as needed (otc eye drop prn pain).   Yes Historical Provider, MD  pantoprazole (PROTONIX) 40 MG tablet Take 40 mg by mouth daily.   Yes Historical Provider, MD  pyridOXINE (VITAMIN  B-6) 100 MG tablet Take 100 mg by mouth daily.   Yes Historical Provider, MD  sucralfate (CARAFATE) 1 G tablet Take 1 tablet (1 g total) by mouth 4 (four) times daily. 30 minutes before eating 01/16/13  Yes John-Adam Bonk, MD    FAMILY HISTORY: No family history of coronary artery disease  SOCIAL HISTORY:  reports that he has never smoked. He does not have any smokeless tobacco history on file. He reports that he does not drink alcohol or use illicit drugs.  REVIEW OF SYSTEMS:  Constitutional:   No  weight loss, night sweats,  Fevers, chills,  fatigue.  HEENT:    No headaches, Difficulty swallowing,Tooth/dental problems,Sore throat,  No sneezing, itching, ear ache, nasal congestion, post nasal drip,   Cardio-vascular: No   Orthopnea, PND, swelling in lower extremities, anasarca, dizziness, palpitations  GI:  No diarrhea, change in bowel habits, loss of appetite  Resp: No shortness of breath with exertion or at rest.  No excess mucus, no productive cough, No non-productive cough,  No coughing up of blood.No change in color of mucus.No wheezing.No chest wall deformity  Skin:  no rash or lesions.  GU:  no dysuria, change in color of urine, no urgency or frequency.  No flank pain.  Musculoskeletal: No joint pain or swelling.  No decreased range of motion.  No back pain.  Psych: No change in mood or affect. No depression or anxiety.  No memory loss.   PHYSICAL EXAM: Blood pressure 128/79, pulse 78, temperature 98.2 F (36.8 C), temperature source Oral, resp. rate 37, SpO2 100.00%.  General appearance :Awake, alert, in mild to moderate distress from pain. Anxious appearing. Speech Clear.  HEENT: Atraumatic and Normocephalic, pupils equally reactive to light and accomodation Neck: supple, no JVD. No cervical lymphadenopathy.  Chest:Good air entry bilaterally, no added sounds  CVS: S1 S2 regular, no murmurs.  Abdomen: Bowel sounds present, there is a lot of voluntary guarding, but abdomen is mostly soft he does have tenderness in her abdominal area diffusely. There is no rebound tenderness. Extremities: B/L Lower Ext shows no edema, both legs are warm to touch Neurology: Awake alert, and oriented X 3, CN II-XII intact, Non focal Skin:No Rash Wounds:N/A  LABS ON ADMISSION:   Recent Labs  01/20/13 1409  NA 131*  K 5.2*  CL 98  CO2 21  GLUCOSE 254*  BUN 10  CREATININE 1.04  CALCIUM 9.7    Recent Labs  01/20/13 1409  AST 35  ALT 79*  ALKPHOS 82  BILITOT 0.5  PROT 8.2  ALBUMIN 4.3    Recent Labs   01/20/13 1409  LIPASE 82*    Recent Labs  01/20/13 1409  WBC 4.6  NEUTROABS 3.5  HGB 14.3  HCT 41.6  MCV 71.2*  PLT 248   No results found for this basename: CKTOTAL, CKMB, CKMBINDEX, TROPONINI,  in the last 72 hours No results found for this basename: DDIMER,  in the last 72 hours No components found with this basename: POCBNP,    RADIOLOGIC STUDIES ON ADMISSION: Dg Abd Acute W/chest  01/20/2013   *RADIOLOGY REPORT*  Clinical Data: Abdominal pain  ACUTE ABDOMEN SERIES (ABDOMEN 2 VIEW & CHEST 1 VIEW)  Comparison: CT abdomen pelvis dated 01/16/2013  Findings: Lungs are essentially clear.  No focal consolidation.  No pleural effusion or pneumothorax.  Nonobstructive bowel gas pattern.  No evidence of free air under the diaphragm on the upright view.  Visualized osseous structures are within normal limits.  IMPRESSION: No evidence of acute cardiopulmonary disease.  No evidence of small bowel obstruction or free air.   Original Report Authenticated By: Charline Bills, M.D.     EKG: Independently reviewed. Normal sinus rhythm  ASSESSMENT AND PLAN: Abdominal pain/Chest Pain - I suspect that this patient has peptic ulcer disease or pancreatitis. However pain and discomfort seems out of proportion-? Element of anxiety. Given his persistent nature of his pain and elevated lactate levels (although mild), I will admit him to a step down unit for now and get a CT angiogram of his chest and his abdomen. I suspect he would need a endoscopy as the next step. I have asked  GI to evaluate, and Dr. Juanda Chance will see the patient shortly - For now, we'll keep him n.p.o., place PPI twice a day and provide narcotics for pain. Will also place on as needed antiemetics - Further plan will depend on Radiological studies and EGD results.  Diabetes - Stop metformin and Levemir - Place on sliding scale insulin  Hypertension - Hold lisinopril for now-as patient will be n.p.o.  - monitor blood pressure and  start medications monitor his BP trend and start as needed IV medications  Further plan will depend as patient's clinical course evolves and further radiologic and laboratory data become available. Patient will be monitored closely.   DVT Prophylaxis: SCD's  Code Status: Full Code  Total time spent for admission equals 45 minutes.  [facility] Pager [phone]  If 7PM-7AM, please contact night-coverage [email or web address] Password [facility] 01/20/2013, 6:06 PM

## 2013-01-20 NOTE — ED Notes (Signed)
attemped report to charge RN of [phone].

## 2013-01-20 NOTE — ED Notes (Signed)
Admitting MD at bedside.

## 2013-01-21 ENCOUNTER — Encounter (HOSPITAL_COMMUNITY): Payer: Self-pay | Admitting: *Deleted

## 2013-01-21 ENCOUNTER — Encounter (HOSPITAL_COMMUNITY)
Admission: EM | Disposition: A | Discharge status: Home / Self Care | Payer: Self-pay | Source: Home / Self Care | Attending: Internal Medicine

## 2013-01-21 DIAGNOSIS — R1110 Vomiting, unspecified: Secondary | ICD-10-CM

## 2013-01-21 HISTORY — PX: ESOPHAGOGASTRODUODENOSCOPY: SHX5428

## 2013-01-21 LAB — COMPREHENSIVE METABOLIC PANEL
Alkaline Phosphatase: 74 U/L (ref 39–117)
BUN: 8 mg/dL (ref 6–23)
Calcium: 8.7 mg/dL (ref 8.4–10.5)
GFR calc Af Amer: >90 mL/min (ref >90)
GFR calc non Af Amer: 82 mL/min — ABNORMAL LOW (ref >90)
Glucose, Bld: 117 mg/dL — ABNORMAL HIGH (ref 70–99)
Potassium: 3.6 mEq/L (ref 3.5–5.1)
Total Protein: 7.2 g/dL (ref 6.0–8.3)

## 2013-01-21 LAB — CBC
HCT: 38.5 % — ABNORMAL LOW (ref 39.0–52.0)
RDW: 13.6 % (ref 11.5–15.5)
WBC: 4.7 10*3/uL (ref 4.0–10.5)

## 2013-01-21 LAB — TROPONIN I
Troponin I: <0.3 ng/mL (ref <0.30)
Troponin I: <0.3 ng/mL (ref <0.30)

## 2013-01-21 LAB — RAPID URINE DRUG SCREEN, HOSP PERFORMED
Amphetamines: NOT DETECTED
Benzodiazepines: NOT DETECTED
Opiates: POSITIVE — AB

## 2013-01-21 LAB — HIV ANTIBODY (ROUTINE TESTING W REFLEX): HIV: NONREACTIVE

## 2013-01-21 LAB — GLUCOSE, CAPILLARY: Glucose-Capillary: 91 mg/dL (ref 70–99)

## 2013-01-21 LAB — SEDIMENTATION RATE: Sed Rate: 8 mm/hr (ref 0–16)

## 2013-01-21 LAB — AFP TUMOR MARKER: AFP-Tumor Marker: 1.8 ng/mL (ref 0.0–8.0)

## 2013-01-21 SURGERY — EGD (ESOPHAGOGASTRODUODENOSCOPY)
Anesthesia: Moderate Sedation

## 2013-01-21 MED ORDER — MIDAZOLAM HCL 10 MG/2ML IJ SOLN
INTRAMUSCULAR | Status: DC | PRN
  Administered 2013-01-21: 2 mg via INTRAVENOUS

## 2013-01-21 MED ORDER — FENTANYL CITRATE 0.05 MG/ML IJ SOLN
INTRAMUSCULAR | Status: AC
  Filled 2013-01-21: qty 4

## 2013-01-21 MED ORDER — SODIUM CHLORIDE 0.9 % IJ SOLN
INTRAMUSCULAR | Status: AC
  Administered 2013-01-21: 10 mL
  Filled 2013-01-21: qty 10

## 2013-01-21 MED ORDER — FENTANYL CITRATE 0.05 MG/ML IJ SOLN
INTRAMUSCULAR | Status: DC | PRN
  Administered 2013-01-21: 25 ug via INTRAVENOUS

## 2013-01-21 MED ORDER — BUTAMBEN-TETRACAINE-BENZOCAINE 2-2-14 % EX AERO
INHALATION_SPRAY | CUTANEOUS | Status: DC | PRN
  Administered 2013-01-21: 2 via TOPICAL

## 2013-01-21 MED ORDER — SODIUM CHLORIDE 0.9 % IV SOLN: INTRAVENOUS | Status: DC

## 2013-01-21 MED ORDER — MIDAZOLAM HCL 5 MG/ML IJ SOLN
INTRAMUSCULAR | Status: AC
  Filled 2013-01-21: qty 2

## 2013-01-21 NOTE — Op Note (Addendum)
[facility] [address] [place], [address]   ENDOSCOPY PROCEDURE REPORT  PATIENT: Adam Horton, Adam Horton  MR#: [id number] BIRTHDATE: 07/12/1966 , 47  yrs. old GENDER: Male ENDOSCOPIST: Hart Carwin, MD REFERRED BY: HOSPITALIST SERVIS PROCEDURE DATE:  01/21/2013 PROCEDURE:  EGD w/ biopsy ASA CLASS:     Class II INDICATIONS:  Epigastric pain.  abnormal CT scan- liver lesions, Hep C serology positive,, elevated lipase MEDICATIONS: These medications were titrated to patient response per physician's verbal order, Versed 3 mg IV, and Fentanyl 25 mcg IV TOPICAL ANESTHETIC: Cetacaine Spray  DESCRIPTION OF PROCEDURE: After the risks benefits and alternatives of the procedure were thoroughly explained, informed consent was obtained.  The Pentax Gastroscope [id number] endoscope was introduced through the mouth and advanced to the second portion of the duodenum. Without limitations.  The instrument was slowly withdrawn as the mucosa was fully examined.        ESOPHAGUS: The mucosa of the esophagus appeared normal.  STOMACH: Mild acute gastritis (inflammation) was found in the gastric antrum.  One smapp prepyloric erosion, biopsiez taken to r/o granulomas and AF organism, also gastric aspirate sent for AFB smear and culture A biopsy was performed using cold forceps.  DUODENUM: The duodenal mucosa showed no abnormalities in the bulb and second portion of the duodenum.  Retroflexed views revealed no abnormalities.     The scope was then withdrawn from the patient and the procedure completed.  COMPLICATIONS: There were no complications. ENDOSCOPIC IMPRESSION: 1.   The mucosa of the esophagus appeared normal 2.   Acute gastritis (inflammation) was found in the gastric antrum; biopsy taken for granulomatous gastritis and AFB smear, gastric aspirate collected for AFB smear 3.   The duodenal mucosa showed no abnormalities in the bulb and second portion of the  duodenum  RECOMMENDATIONS: nothing to account for abdominal pain, needs further evaluation for granulomatous disease ( liver? biopsy) etc REPEAT EXAM: no  Activated:  01/21/2013 1:43 PM   CC:  PATIENT NAME:  Anthoney, Sheppard MR#: [id number]

## 2013-01-21 NOTE — Progress Notes (Signed)
PATIENT DETAILS Name: Adam Horton Age: 47 y.o. Sex: male Date of Birth: February 06, 1966 Admit Date: 01/20/2013 Admitting Physician Dewayne Shorter Levora Dredge, MD PCP:No PCP Per Patient  Subjective: Continues to have constant abdominal pain-with only minimal improvement overnight.  Assessment/Plan: Principal Problem:   Abdominal pain -etiology remains unclear -Peptic Ulcer disease and pancreatitis possible-but pain seems out of proportion to exam and radiological findings -EGD today -CT Angio Chest/Abdomen-no acute abnormalities to explain this amount of pain. -check ESR-?vasculitis given possible hx of Hep C  Active Problems: B/L upper lobe lesion in lung -?TB -c/w Airborne isolation -check Gold Quantiferon, AFB sputum, HIV -await results of EGD before consulting ID or PCCM    DM (diabetes mellitus) -CBG's stable -c/w SSI -resume Levemir depending on EGD results and when not NPO    HTN (hypertension) -moderate control off Lisinopril -when not NPO will resume ACEI -prn Hydralazine  Disposition: Remain inpatient  DVT Prophylaxis:  SCD's  Code Status: Full code   Family Communication None at bedside  Procedures:  None  CONSULTS:  GI   MEDICATIONS: Scheduled Meds: . insulin aspart  0-9 Units Subcutaneous TID WC  . pantoprazole (PROTONIX) IV  40 mg Intravenous Q12H  . sodium chloride  3 mL Intravenous Q12H   Continuous Infusions: . sodium chloride 150 mL/hr at 01/21/13 0859  . sodium chloride     PRN Meds:.acetaminophen, acetaminophen, albuterol, butamben-tetracaine-benzocaine, diphenhydrAMINE, fentaNYL, gi cocktail, HYDROmorphone (DILAUDID) injection, midazolam, ondansetron (ZOFRAN) IV, ondansetron  Antibiotics: Anti-infectives   None       PHYSICAL EXAM: Vital signs in last 24 hours: Filed Vitals:   01/21/13 1100 01/21/13 1302 01/21/13 1309 01/21/13 1311  BP: 111/95     Pulse: 80 88 82 83  Temp:      TempSrc:      Resp: 13 18 16 20   Height:       Weight:      SpO2: 100% 100% 100% 100%    Weight change:  Filed Weights   01/20/13 2000  Weight: 60.6 kg (133 lb 9.6 oz)   Body mass index is 21.57 kg/(m^2).   Gen Exam: Awake and alert with clear speech.   Neck: Supple, No JVD.   Chest: B/L Clear.   CVS: S1 S2 Regular, no murmurs.  Abdomen: soft, BS +, mildly tender in epigastric, non distended. No rebound Extremities: no edema, lower extremities warm to touch Neurologic: Non Focal.   Skin: No Rash.   Wounds: N/A.    Intake/Output from previous day:  Intake/Output Summary (Last 24 hours) at 01/21/13 1324 Last data filed at 01/21/13 1100  Gross per 24 hour  Intake   2225 ml  Output    650 ml  Net   1575 ml     LAB RESULTS: CBC  Recent Labs Lab 01/15/13 2340 01/20/13 1409 01/21/13 0520  WBC 5.0 4.6 4.7  HGB 13.8 14.3 12.8*  HCT 40.6 41.6 38.5*  PLT 240 248 230  MCV 70.4* 71.2* 72.0*  MCH 23.9* 24.5* 23.9*  MCHC 34.0 34.4 33.2  RDW 13.4 13.6 13.6  LYMPHSABS 2.2 1.0  --   MONOABS 0.6 0.1  --   EOSABS 0.0 0.0  --   BASOSABS 0.0 0.0  --     Chemistries   Recent Labs Lab 01/15/13 2340 01/20/13 1409 01/21/13 0520  NA 134* 131* 140  K 3.9 5.2* 3.6  CL 98 98 104  CO2 21 21 24   GLUCOSE 285* 254* 117*  BUN 8 10 8   CREATININE  1.00 1.04 1.06  CALCIUM 9.7 9.7 8.7    CBG:  Recent Labs Lab 01/15/13 2310 01/20/13 2012 01/21/13 0819 01/21/13 1148  GLUCAP 324* 127* 114* 130*    GFR Estimated Creatinine Clearance: 74.6 ml/min (by C-G formula based on Cr of 1.06).  Coagulation profile No results found for this basename: INR, PROTIME,  in the last 168 hours  Cardiac Enzymes  Recent Labs Lab 01/20/13 1825 01/20/13 2343 01/21/13 0520  TROPONINI <0.30 <0.30 <0.30    No components found with this basename: POCBNP,  No results found for this basename: DDIMER,  in the last 72 hours No results found for this basename: HGBA1C,  in the last 72 hours No results found for this basename: CHOL, HDL,  LDLCALC, TRIG, CHOLHDL, LDLDIRECT,  in the last 72 hours No results found for this basename: TSH, T4TOTAL, FREET3, T3FREE, THYROIDAB,  in the last 72 hours No results found for this basename: VITAMINB12, FOLATE, FERRITIN, TIBC, IRON, RETICCTPCT,  in the last 72 hours  Recent Labs  01/20/13 1409 01/21/13 0820  LIPASE 82* 47    Urine Studies No results found for this basename: UACOL, UAPR, USPG, UPH, UTP, UGL, UKET, UBIL, UHGB, UNIT, UROB, ULEU, UEPI, UWBC, URBC, UBAC, CAST, CRYS, UCOM, BILUA,  in the last 72 hours  MICROBIOLOGY: Recent Results (from the past 240 hour(s))  CULTURE, BLOOD (ROUTINE X 2)     Status: None   Collection Time    01/14/13  1:25 PM      Result Value Range Status   Specimen Description BLOOD LEFT ANTECUBITAL   Final   Special Requests BOTTLES DRAWN AEROBIC AND ANAEROBIC 3CC   Final   Culture  Setup Time 01/14/2013 19:00   Final   Culture NO GROWTH 5 DAYS   Final   Report Status 01/20/2013 FINAL   Final  CULTURE, BLOOD (ROUTINE X 2)     Status: None   Collection Time    01/14/13  1:31 PM      Result Value Range Status   Specimen Description BLOOD LEFT HAND   Final   Special Requests BOTTLES DRAWN AEROBIC ONLY 3CC   Final   Culture  Setup Time 01/14/2013 19:00   Final   Culture NO GROWTH 5 DAYS   Final   Report Status 01/20/2013 FINAL   Final  MRSA PCR SCREENING     Status: None   Collection Time    01/20/13  8:16 PM      Result Value Range Status   MRSA by PCR NEGATIVE  NEGATIVE Final   Comment:            The GeneXpert MRSA Assay (FDA     approved for NASAL specimens     only), is one component of a     comprehensive MRSA colonization     surveillance program. It is not     intended to diagnose MRSA     infection nor to guide or     monitor treatment for     MRSA infections.    RADIOLOGY STUDIES/RESULTS: Ct Angio Chest Pe W/cm &/or Wo Cm  01/20/2013   *RADIOLOGY REPORT*  Clinical Data:  Chest pain.  Abdominal pain.  CT ANGIOGRAPHY CHEST,  ABDOMEN AND PELVIS  Technique:  Multidetector CT imaging through the chest, abdomen and pelvis was performed using the standard protocol during bolus administration of intravenous contrast.  Multiplanar reconstructed images including MIPs were obtained and reviewed to evaluate the vascular anatomy.  Contrast:  OMNIPAQUE IOHEXOL 350 MG/ML SOLN  Comparison:  01/16/2013  CTA CHEST  Findings:  There is no evidence of aortic aneurysm, aortic dissection, or aortic transection.  Innominate artery, right subclavian artery, right common carotid artery, left common carotid artery, left subclavian artery, and bilateral vertebral arteries are patent.  No pericardial effusion.  No abnormal adenopathy by measurement criteria.  No obvious filling defect in the pulmonary arterial tree to suggest acute pulmonary thromboembolism.  There is mild wall thickening of the distal esophagus just above the GE junction.  No pneumothorax.  No pleural effusion.  Pleural-based 2.2 x 1.2 cm mass at the right apex associated with surrounding micronodules.  There are other irregular, triangular, and patchy opacities within the right upper lobe. 8 mm spiculated opacity in the left upper lobe towards the apex on image 37.  Dependent atelectasis at the lung bases.  No acute bony deformity.   Review of the MIP images confirms the above findings.  IMPRESSION: No evidence of aortic dissection.  Bilateral apical lung masses.  Primary considerations include malignancy and TB.  Small nodules and irregular opacities within the right upper lobe support the possibility of mycobacterium infection.  Correlate clinically as for the need for workup.  CTA ABDOMEN AND PELVIS  Findings:  No evidence of aortic dissection or aortic aneurysm.  Celiac, SMA, and IMA are patent.  Branch vessels are patent. Single renal arteries are patent.  Bilateral internal, external, and common iliac arteries are patent.  Multiple small foci of arterial enhancement within the liver  are present.  Given history of hepatitis C, hepatic cellular carcinoma is not excluded.  Gallbladder, pancreas, spleen, adrenal glands, kidneys are stable in appearance.  Appendix is not visualized.  Bladder is within normal limits.  No free fluid.  No obvious abnormal adenopathy.  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  Bilateral L5 nerve root encroachment at this level.   Review of the MIP images confirms the above findings.  IMPRESSION: No evidence of aortic dissection or acute arterial abnormality.  Multiple foci of arterial enhancement throughout the liver.  Given history of hepatitis C, hepatic cellular carcinoma is not excluded. MRI is recommended to further characterize.  Otherwise stable exam.   Original Report Authenticated By: Jolaine Click, M.D.   US Abdomen Complete  01/16/2013   *RADIOLOGY REPORT*  Clinical Data:  Lower quadrant abdominal pain.  COMPLETE ABDOMINAL ULTRASOUND  Comparison:  CT scan 01/14/2013.  Findings:  Gallbladder:  Echogenic sludge noted layering in the gallbladder. No gallbladder wall thickening, pericholecystic fluid or definite gallstones.  Common bile duct:  Normal in caliber measuring a maximum of 4.65mm.  Liver:  There is diffuse increased echogenicity of the liver and decreased through transmission consistent with fatty infiltration. No focal lesions or biliary dilatation.  IVC:  Normal caliber.  Pancreas:  Not well visualized due to overlying bowel gas but was normal on the recent CT scan.  Spleen:  Normal size and echogenicity without focal lesions.  Right Kidney:  10.0 cm in length. Normal renal cortical thickness and echogenicity without focal lesions or hydronephrosis.  Left Kidney:  10.8 cm in length. Normal renal cortical thickness and echogenicity without focal lesions or hydronephrosis.  Abdominal aorta:  Normal.  IMPRESSION:  1.  Layering echogenic sludge in the gallbladder but no definite gallstones or findings for acute cholecystitis. 2.  Normal  caliber common bile duct. 3.  Diffuse fatty infiltration of the liver. 4.  Poor visualization of the pancreas.  Original Report Authenticated By: Rudie Meyer, M.D.   Ct Abdomen Pelvis W Contrast  01/16/2013   *RADIOLOGY REPORT*  Clinical Data: Epigastric pain.  CT ABDOMEN AND PELVIS WITH CONTRAST  Technique:  Multidetector CT imaging of the abdomen and pelvis was performed following the standard protocol during bolus administration of intravenous contrast.  Contrast: OMNIPAQUE IOHEXOL 300 MG/ML  SOLN  Comparison: 01/14/2013.  Findings: The lung bases are clear except for dependent atelectasis.  Mild diffuse fatty infiltration of the liver but no focal hepatic lesions or intrahepatic biliary dilatation.  Gallbladder is grossly normal.  No common bile duct dilatation.  The pancreas is normal. The spleen is normal.  The adrenal glands and kidneys are normal.  The stomach, duodenum, small bowel and colon are unremarkable.  No inflammatory changes or mass lesions.  The appendix is surgically absent.  No mesenteric or retroperitoneal mass or adenopathy.  The aorta is normal in caliber.  The major branch vessels are patent.  The bladder is distended almost to the level of the umbilicus.  No pelvic mass, adenopathy or free pelvic fluid collections.  The prostate gland and seminal vesicles are unremarkable.  No inguinal mass or hernia.  The bony pelvis is intact.  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  IMPRESSION:  1.  Distended bladder almost to the level of the umbilicus. 2.  No other significant abdominal/pelvic findings demonstrated.   Original Report Authenticated By: Rudie Meyer, M.D.   Ct Abdomen Pelvis W Contrast  01/14/2013   *RADIOLOGY REPORT*  Clinical Data: Chest pain.  Abdominal pain.  CT ABDOMEN AND PELVIS WITH CONTRAST  Technique:  Multidetector CT imaging of the abdomen and pelvis was performed following the standard protocol during bolus  administration of intravenous contrast.  Contrast: OMNIPAQUE IOHEXOL 300 MG/ML  SOLN, 50mL OMNIPAQUE IOHEXOL 300 MG/ML  SOLN  Comparison: 09/19/2012  Findings: Diffuse hepatic steatosis.  Gallbladder, spleen, pancreas, adrenal glands, kidneys are within normal limits.  Bladder is distended.  Unremarkable prostate.  No free fluid.  No abnormal adenopathy.  Post appendectomy clips.  Grade 1 L5-S1 spondylolisthesis.  IMPRESSION: No acute intra-abdominal or intrapelvic pathology.  Stable.   Original Report Authenticated By: Jolaine Click, M.D.   Dg Chest Portable 1 View  01/14/2013   *RADIOLOGY REPORT*  Clinical Data: Short of breath and cough  PORTABLE CHEST - 1 VIEW  Comparison: 11/21/2010  Findings: Heart is normal in size.  Lungs are clear.  No pleural effusion.  No pneumothorax.  No acute bony deformity.  IMPRESSION: No active cardiopulmonary disease.   Original Report Authenticated By: Jolaine Click, M.D.   Dg Abd Acute W/chest  01/20/2013   *RADIOLOGY REPORT*  Clinical Data: Abdominal pain  ACUTE ABDOMEN SERIES (ABDOMEN 2 VIEW & CHEST 1 VIEW)  Comparison: CT abdomen pelvis dated 01/16/2013  Findings: Lungs are essentially clear.  No focal consolidation.  No pleural effusion or pneumothorax.  Nonobstructive bowel gas pattern.  No evidence of free air under the diaphragm on the upright view.  Visualized osseous structures are within normal limits.  IMPRESSION: No evidence of acute cardiopulmonary disease.  No evidence of small bowel obstruction or free air.   Original Report Authenticated By: Charline Bills, M.D.   Ct Angio Abd/pel W/ And/or W/o  01/20/2013   *RADIOLOGY REPORT*  Clinical Data:  Chest pain.  Abdominal pain.  CT ANGIOGRAPHY CHEST, ABDOMEN AND PELVIS  Technique:  Multidetector CT imaging through the chest, abdomen and  pelvis was performed using the standard protocol during bolus administration of intravenous contrast.  Multiplanar reconstructed images including MIPs were obtained and  reviewed to evaluate the vascular anatomy.  Contrast: OMNIPAQUE IOHEXOL 350 MG/ML SOLN  Comparison:  01/16/2013  CTA CHEST  Findings:  There is no evidence of aortic aneurysm, aortic dissection, or aortic transection.  Innominate artery, right subclavian artery, right common carotid artery, left common carotid artery, left subclavian artery, and bilateral vertebral arteries are patent.  No pericardial effusion.  No abnormal adenopathy by measurement criteria.  No obvious filling defect in the pulmonary arterial tree to suggest acute pulmonary thromboembolism.  There is mild wall thickening of the distal esophagus just above the GE junction.  No pneumothorax.  No pleural effusion.  Pleural-based 2.2 x 1.2 cm mass at the right apex associated with surrounding micronodules.  There are other irregular, triangular, and patchy opacities within the right upper lobe. 8 mm spiculated opacity in the left upper lobe towards the apex on image 37.  Dependent atelectasis at the lung bases.  No acute bony deformity.   Review of the MIP images confirms the above findings.  IMPRESSION: No evidence of aortic dissection.  Bilateral apical lung masses.  Primary considerations include malignancy and TB.  Small nodules and irregular opacities within the right upper lobe support the possibility of mycobacterium infection.  Correlate clinically as for the need for workup.  CTA ABDOMEN AND PELVIS  Findings:  No evidence of aortic dissection or aortic aneurysm.  Celiac, SMA, and IMA are patent.  Branch vessels are patent. Single renal arteries are patent.  Bilateral internal, external, and common iliac arteries are patent.  Multiple small foci of arterial enhancement within the liver are present.  Given history of hepatitis C, hepatic cellular carcinoma is not excluded.  Gallbladder, pancreas, spleen, adrenal glands, kidneys are stable in appearance.  Appendix is not visualized.  Bladder is within normal limits.  No free fluid.  No  obvious abnormal adenopathy.  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  Bilateral L5 nerve root encroachment at this level.   Review of the MIP images confirms the above findings.  IMPRESSION: No evidence of aortic dissection or acute arterial abnormality.  Multiple foci of arterial enhancement throughout the liver.  Given history of hepatitis C, hepatic cellular carcinoma is not excluded. MRI is recommended to further characterize.  Otherwise stable exam.   Original Report Authenticated By: Jolaine Click, M.D.    Jeoffrey Massed, MD  [facility] Pager:[phone]  If 7PM-7AM, please contact night-coverage [email or web address] Password [email or web address] 01/21/2013, 1:24 PM   LOS: 1 day

## 2013-01-22 LAB — GLUCOSE, CAPILLARY
Glucose-Capillary: 114 mg/dL — ABNORMAL HIGH (ref 70–99)
Glucose-Capillary: 172 mg/dL — ABNORMAL HIGH (ref 70–99)
Glucose-Capillary: 173 mg/dL — ABNORMAL HIGH (ref 70–99)

## 2013-01-22 MED ORDER — SODIUM CHLORIDE 3 % IN NEBU
5.0000 mL | INHALATION_SOLUTION | RESPIRATORY_TRACT | Status: AC | PRN
  Administered 2013-01-22: 5 mL via RESPIRATORY_TRACT
  Filled 2013-01-22 (×2): qty 15

## 2013-01-22 MED ORDER — LISINOPRIL 2.5 MG PO TABS
2.5000 mg | ORAL_TABLET | Freq: Every day | ORAL | Status: DC
  Administered 2013-01-22 – 2013-01-28 (×7): 2.5 mg via ORAL
  Filled 2013-01-22 (×7): qty 1

## 2013-01-22 MED ORDER — OXYCODONE HCL 5 MG PO TABS
5.0000 mg | ORAL_TABLET | Freq: Four times a day (QID) | ORAL | Status: DC | PRN
  Administered 2013-01-22 – 2013-01-23 (×2): 5 mg via ORAL
  Filled 2013-01-22 (×5): qty 1

## 2013-01-22 MED ORDER — SODIUM CHLORIDE 0.9 % IJ SOLN
INTRAMUSCULAR | Status: AC
  Administered 2013-01-22: 13 mL
  Filled 2013-01-22: qty 10

## 2013-01-22 MED ORDER — INSULIN DETEMIR 100 UNIT/ML ~~LOC~~ SOLN
10.0000 [IU] | Freq: Every day | SUBCUTANEOUS | Status: DC
  Administered 2013-01-22: 10 [IU] via SUBCUTANEOUS
  Filled 2013-01-22 (×2): qty 0.1

## 2013-01-22 NOTE — Progress Notes (Signed)
Chaplain visited pt upon nurse request. Pt  was hearing some voices in his room. Chaplain shared words of encouragement and prayed with pt. Pt thanked chaplain for the visit and spiritual support. Kelle Darting [phone]

## 2013-01-22 NOTE — Progress Notes (Signed)
.   Gastroenterology Progress Note   Subjective  Reports no abdominal pain but appears anxious, he was bothered by a noise in his room last night, no vomiting   Objective   I saw pt earlier today and he was sitting in a recliner and was resting, he admitted to feeling better but was anxious, agitated. EGD did not show any pathology, AFB smear preliminary negative. LFT's improved Vital signs in last 24 hours: Temp:  [97.8 F (36.6 C)-98.3 F (36.8 C)] 97.8 F (36.6 C) (05/25 2113) Pulse Rate:  [60-80] 68 (05/25 2113) Resp:  [13-22] 20 (05/25 2113) BP: (122-165)/(76-99) 147/84 mmHg (05/25 2113) SpO2:  [99 %-100 %] 100 % (05/25 2113) Last BM Date: 01/22/13 General:   cambodian man , appears anxious Heart:  Regular rate and rhythm; no murmurs Lungs: Respirations even and unlabored, lungs CTA bilaterally Abdomen:  Soft, mild tenderness LUQ and nondistended. Normal bowel sounds., no tympany Extremities:  Without edema. Neurologic:  Alert and oriented,  grossly normal neurologically. Psych:  Cooperative. anxious.  Intake/Output from previous day: 05/24 0701 - 05/25 0700 In: 3930 [P.O.:480; I.V.:3450] Out: 2500 [Urine:2500] Intake/Output this shift: Total I/O In: 60 [P.O.:60] Out: 800 [Urine:800]  Lab Results:  Recent Labs  01/20/13 1409 01/21/13 0520  WBC 4.6 4.7  HGB 14.3 12.8*  HCT 41.6 38.5*  PLT 248 230   BMET  Recent Labs  01/20/13 1409 01/21/13 0520  NA 131* 140  K 5.2* 3.6  CL 98 104  CO2 21 24  GLUCOSE 254* 117*  BUN 10 8  CREATININE 1.04 1.06  CALCIUM 9.7 8.7   LFT  Recent Labs  01/21/13 0520  PROT 7.2  ALBUMIN 3.7  AST 31  ALT 63*  ALKPHOS 74  BILITOT 0.5   PT/INR No results found for this basename: LABPROT, INR,  in the last 72 hours  Studies/Results: No results found.     Assessment:  LFT's improved, Hep C RNA pending, would hold off on liver biopsy pending results of TB microbiology and Quantiferon. He may be discharged from  GI standpoint unless ID needs to clarify TB status while in the hospital.His N&V and abd pain have resolved.      Plan:  Please call for further questions  Principal Problem:   Abdominal pain Active Problems:   DM (diabetes mellitus)   HTN (hypertension)   Nonspecific (abnormal) findings on radiological and other examination of biliary tract   Other abnormal blood chemistry     LOS: 2 days   Lina Sar  01/22/2013, 9:40 PM

## 2013-01-22 NOTE — Progress Notes (Signed)
PATIENT DETAILS Name: Adam Horton Age: 47 y.o. Sex: male Date of Birth: 1965/10/15 Admit Date: 01/20/2013 Admitting Physician Dewayne Shorter Levora Dredge, MD PCP:No PCP Per Patient  Subjective: Abdominal pain better, tolerating full liquids  Assessment/Plan: Principal Problem:   Abdominal pain -etiology remains unclear-but clearly better -EGD 5/24-showed gastritis-do not think this still explains his abdominal pain -CT Angio Chest/Abdomen-no acute abnormalities to explain this amount of pain. -check ESR only 8, so doubt a chronic inflammatory condition -??anxiety was playing a role -Minimize Narcotics  Active Problems: B/L upper lobe lesion in lung -?TB -c/w Airborne isolation - Gold Quantiferon, AFB sputum-pending, but gastric aspirate neg for AFB -HIV negative    DM (diabetes mellitus) -CBG's stable -c/w SSI -resume Levemir at reduced dose    HTN (hypertension) -resume Lisinopril  Disposition: Remain inpatient-transfer to med-surg  DVT Prophylaxis:  SCD's  Code Status: Full code   Family Communication None at bedside  Procedures:  None  CONSULTS:  GI   MEDICATIONS: Scheduled Meds: . insulin aspart  0-9 Units Subcutaneous TID WC  . pantoprazole (PROTONIX) IV  40 mg Intravenous Q12H  . sodium chloride  3 mL Intravenous Q12H   Continuous Infusions: . sodium chloride 75 mL/hr at 01/22/13 0742   PRN Meds:.acetaminophen, acetaminophen, albuterol, diphenhydrAMINE, gi cocktail, ondansetron (ZOFRAN) IV, ondansetron, oxyCODONE  Antibiotics: Anti-infectives   None       PHYSICAL EXAM: Vital signs in last 24 hours: Filed Vitals:   01/21/13 2314 01/22/13 0305 01/22/13 0742 01/22/13 1116  BP: 144/76 157/93 165/88 122/99  Pulse: 66 67 77 80  Temp:  97.9 F (36.6 C) 98.2 F (36.8 C) 98.2 F (36.8 C)  TempSrc:  Oral Oral Oral  Resp: 13 22 17 20   Height:      Weight:      SpO2: 99% 100% 100% 100%    Weight change:  Filed Weights   01/20/13 2000   Weight: 60.6 kg (133 lb 9.6 oz)   Body mass index is 21.57 kg/(m^2).   Gen Exam: Awake and alert with clear speech.   Neck: Supple, No JVD.   Chest: B/L Clear.   CVS: S1 S2 Regular, no murmurs.  Abdomen: soft, BS +, mildly tender in epigastric, non distended. No rebound Extremities: no edema, lower extremities warm to touch Neurologic: Non Focal.   Skin: No Rash.   Wounds: N/A.    Intake/Output from previous day:  Intake/Output Summary (Last 24 hours) at 01/22/13 1258 Last data filed at 01/22/13 1116  Gross per 24 hour  Intake   3255 ml  Output   2725 ml  Net    530 ml     LAB RESULTS: CBC  Recent Labs Lab 01/15/13 2340 01/20/13 1409 01/21/13 0520  WBC 5.0 4.6 4.7  HGB 13.8 14.3 12.8*  HCT 40.6 41.6 38.5*  PLT 240 248 230  MCV 70.4* 71.2* 72.0*  MCH 23.9* 24.5* 23.9*  MCHC 34.0 34.4 33.2  RDW 13.4 13.6 13.6  LYMPHSABS 2.2 1.0  --   MONOABS 0.6 0.1  --   EOSABS 0.0 0.0  --   BASOSABS 0.0 0.0  --     Chemistries   Recent Labs Lab 01/15/13 2340 01/20/13 1409 01/21/13 0520  NA 134* 131* 140  K 3.9 5.2* 3.6  CL 98 98 104  CO2 21 21 24   GLUCOSE 285* 254* 117*  BUN 8 10 8   CREATININE 1.00 1.04 1.06  CALCIUM 9.7 9.7 8.7    CBG:  Recent Labs Lab 01/21/13  1148 01/21/13 1640 01/21/13 2151 01/22/13 0757 01/22/13 1115  GLUCAP 130* 91 195* 114* 157*    GFR Estimated Creatinine Clearance: 74.6 ml/min (by C-G formula based on Cr of 1.06).  Coagulation profile No results found for this basename: INR, PROTIME,  in the last 168 hours  Cardiac Enzymes  Recent Labs Lab 01/20/13 1825 01/20/13 2343 01/21/13 0520  TROPONINI <0.30 <0.30 <0.30    No components found with this basename: POCBNP,  No results found for this basename: DDIMER,  in the last 72 hours No results found for this basename: HGBA1C,  in the last 72 hours No results found for this basename: CHOL, HDL, LDLCALC, TRIG, CHOLHDL, LDLDIRECT,  in the last 72 hours No results found  for this basename: TSH, T4TOTAL, FREET3, T3FREE, THYROIDAB,  in the last 72 hours No results found for this basename: VITAMINB12, FOLATE, FERRITIN, TIBC, IRON, RETICCTPCT,  in the last 72 hours  Recent Labs  01/20/13 1409 01/21/13 0820  LIPASE 82* 47    Urine Studies No results found for this basename: UACOL, UAPR, USPG, UPH, UTP, UGL, UKET, UBIL, UHGB, UNIT, UROB, ULEU, UEPI, UWBC, URBC, UBAC, CAST, CRYS, UCOM, BILUA,  in the last 72 hours  MICROBIOLOGY: Recent Results (from the past 240 hour(s))  CULTURE, BLOOD (ROUTINE X 2)     Status: None   Collection Time    01/14/13  1:25 PM      Result Value Range Status   Specimen Description BLOOD LEFT ANTECUBITAL   Final   Special Requests BOTTLES DRAWN AEROBIC AND ANAEROBIC 3CC   Final   Culture  Setup Time 01/14/2013 19:00   Final   Culture NO GROWTH 5 DAYS   Final   Report Status 01/20/2013 FINAL   Final  CULTURE, BLOOD (ROUTINE X 2)     Status: None   Collection Time    01/14/13  1:31 PM      Result Value Range Status   Specimen Description BLOOD LEFT HAND   Final   Special Requests BOTTLES DRAWN AEROBIC ONLY 3CC   Final   Culture  Setup Time 01/14/2013 19:00   Final   Culture NO GROWTH 5 DAYS   Final   Report Status 01/20/2013 FINAL   Final  MRSA PCR SCREENING     Status: None   Collection Time    01/20/13  8:16 PM      Result Value Range Status   MRSA by PCR NEGATIVE  NEGATIVE Final   Comment:            The GeneXpert MRSA Assay (FDA     approved for NASAL specimens     only), is one component of a     comprehensive MRSA colonization     surveillance program. It is not     intended to diagnose MRSA     infection nor to guide or     monitor treatment for     MRSA infections.  AFB CULTURE WITH SMEAR     Status: None   Collection Time    01/21/13  2:47 PM      Result Value Range Status   Specimen Description GASTRIC ASPIRATE   Final   Special Requests NONE   Final   ACID FAST SMEAR NO ACID FAST BACILLI SEEN   Final    Culture     Final   Value: CULTURE WILL BE EXAMINED FOR 6 WEEKS BEFORE ISSUING A FINAL REPORT   Report Status PENDING  Incomplete    RADIOLOGY STUDIES/RESULTS: Ct Angio Chest Pe W/cm &/or Wo Cm  01/20/2013   *RADIOLOGY REPORT*  Clinical Data:  Chest pain.  Abdominal pain.  CT ANGIOGRAPHY CHEST, ABDOMEN AND PELVIS  Technique:  Multidetector CT imaging through the chest, abdomen and pelvis was performed using the standard protocol during bolus administration of intravenous contrast.  Multiplanar reconstructed images including MIPs were obtained and reviewed to evaluate the vascular anatomy.  Contrast: OMNIPAQUE IOHEXOL 350 MG/ML SOLN  Comparison:  01/16/2013  CTA CHEST  Findings:  There is no evidence of aortic aneurysm, aortic dissection, or aortic transection.  Innominate artery, right subclavian artery, right common carotid artery, left common carotid artery, left subclavian artery, and bilateral vertebral arteries are patent.  No pericardial effusion.  No abnormal adenopathy by measurement criteria.  No obvious filling defect in the pulmonary arterial tree to suggest acute pulmonary thromboembolism.  There is mild wall thickening of the distal esophagus just above the GE junction.  No pneumothorax.  No pleural effusion.  Pleural-based 2.2 x 1.2 cm mass at the right apex associated with surrounding micronodules.  There are other irregular, triangular, and patchy opacities within the right upper lobe. 8 mm spiculated opacity in the left upper lobe towards the apex on image 37.  Dependent atelectasis at the lung bases.  No acute bony deformity.   Review of the MIP images confirms the above findings.  IMPRESSION: No evidence of aortic dissection.  Bilateral apical lung masses.  Primary considerations include malignancy and TB.  Small nodules and irregular opacities within the right upper lobe support the possibility of mycobacterium infection.  Correlate clinically as for the need for workup.  CTA  ABDOMEN AND PELVIS  Findings:  No evidence of aortic dissection or aortic aneurysm.  Celiac, SMA, and IMA are patent.  Branch vessels are patent. Single renal arteries are patent.  Bilateral internal, external, and common iliac arteries are patent.  Multiple small foci of arterial enhancement within the liver are present.  Given history of hepatitis C, hepatic cellular carcinoma is not excluded.  Gallbladder, pancreas, spleen, adrenal glands, kidneys are stable in appearance.  Appendix is not visualized.  Bladder is within normal limits.  No free fluid.  No obvious abnormal adenopathy.  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  Bilateral L5 nerve root encroachment at this level.   Review of the MIP images confirms the above findings.  IMPRESSION: No evidence of aortic dissection or acute arterial abnormality.  Multiple foci of arterial enhancement throughout the liver.  Given history of hepatitis C, hepatic cellular carcinoma is not excluded. MRI is recommended to further characterize.  Otherwise stable exam.   Original Report Authenticated By: Jolaine Click, M.D.   US Abdomen Complete  01/16/2013   *RADIOLOGY REPORT*  Clinical Data:  Lower quadrant abdominal pain.  COMPLETE ABDOMINAL ULTRASOUND  Comparison:  CT scan 01/14/2013.  Findings:  Gallbladder:  Echogenic sludge noted layering in the gallbladder. No gallbladder wall thickening, pericholecystic fluid or definite gallstones.  Common bile duct:  Normal in caliber measuring a maximum of 4.19mm.  Liver:  There is diffuse increased echogenicity of the liver and decreased through transmission consistent with fatty infiltration. No focal lesions or biliary dilatation.  IVC:  Normal caliber.  Pancreas:  Not well visualized due to overlying bowel gas but was normal on the recent CT scan.  Spleen:  Normal size and echogenicity without focal lesions.  Right Kidney:  10.0 cm in length. Normal renal  cortical thickness and echogenicity without focal lesions or  hydronephrosis.  Left Kidney:  10.8 cm in length. Normal renal cortical thickness and echogenicity without focal lesions or hydronephrosis.  Abdominal aorta:  Normal.  IMPRESSION:  1.  Layering echogenic sludge in the gallbladder but no definite gallstones or findings for acute cholecystitis. 2.  Normal caliber common bile duct. 3.  Diffuse fatty infiltration of the liver. 4.  Poor visualization of the pancreas.   Original Report Authenticated By: Rudie Meyer, M.D.   Ct Abdomen Pelvis W Contrast  01/16/2013   *RADIOLOGY REPORT*  Clinical Data: Epigastric pain.  CT ABDOMEN AND PELVIS WITH CONTRAST  Technique:  Multidetector CT imaging of the abdomen and pelvis was performed following the standard protocol during bolus administration of intravenous contrast.  Contrast: OMNIPAQUE IOHEXOL 300 MG/ML  SOLN  Comparison: 01/14/2013.  Findings: The lung bases are clear except for dependent atelectasis.  Mild diffuse fatty infiltration of the liver but no focal hepatic lesions or intrahepatic biliary dilatation.  Gallbladder is grossly normal.  No common bile duct dilatation.  The pancreas is normal. The spleen is normal.  The adrenal glands and kidneys are normal.  The stomach, duodenum, small bowel and colon are unremarkable.  No inflammatory changes or mass lesions.  The appendix is surgically absent.  No mesenteric or retroperitoneal mass or adenopathy.  The aorta is normal in caliber.  The major branch vessels are patent.  The bladder is distended almost to the level of the umbilicus.  No pelvic mass, adenopathy or free pelvic fluid collections.  The prostate gland and seminal vesicles are unremarkable.  No inguinal mass or hernia.  The bony pelvis is intact.  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  IMPRESSION:  1.  Distended bladder almost to the level of the umbilicus. 2.  No other significant abdominal/pelvic findings demonstrated.   Original Report Authenticated By:  Rudie Meyer, M.D.   Ct Abdomen Pelvis W Contrast  01/14/2013   *RADIOLOGY REPORT*  Clinical Data: Chest pain.  Abdominal pain.  CT ABDOMEN AND PELVIS WITH CONTRAST  Technique:  Multidetector CT imaging of the abdomen and pelvis was performed following the standard protocol during bolus administration of intravenous contrast.  Contrast: OMNIPAQUE IOHEXOL 300 MG/ML  SOLN, 50mL OMNIPAQUE IOHEXOL 300 MG/ML  SOLN  Comparison: 09/19/2012  Findings: Diffuse hepatic steatosis.  Gallbladder, spleen, pancreas, adrenal glands, kidneys are within normal limits.  Bladder is distended.  Unremarkable prostate.  No free fluid.  No abnormal adenopathy.  Post appendectomy clips.  Grade 1 L5-S1 spondylolisthesis.  IMPRESSION: No acute intra-abdominal or intrapelvic pathology.  Stable.   Original Report Authenticated By: Jolaine Click, M.D.   Dg Chest Portable 1 View  01/14/2013   *RADIOLOGY REPORT*  Clinical Data: Short of breath and cough  PORTABLE CHEST - 1 VIEW  Comparison: 11/21/2010  Findings: Heart is normal in size.  Lungs are clear.  No pleural effusion.  No pneumothorax.  No acute bony deformity.  IMPRESSION: No active cardiopulmonary disease.   Original Report Authenticated By: Jolaine Click, M.D.   Dg Abd Acute W/chest  01/20/2013   *RADIOLOGY REPORT*  Clinical Data: Abdominal pain  ACUTE ABDOMEN SERIES (ABDOMEN 2 VIEW & CHEST 1 VIEW)  Comparison: CT abdomen pelvis dated 01/16/2013  Findings: Lungs are essentially clear.  No focal consolidation.  No pleural effusion or pneumothorax.  Nonobstructive bowel gas pattern.  No evidence of free air under the diaphragm on the  upright view.  Visualized osseous structures are within normal limits.  IMPRESSION: No evidence of acute cardiopulmonary disease.  No evidence of small bowel obstruction or free air.   Original Report Authenticated By: Charline Bills, M.D.   Ct Angio Abd/pel W/ And/or W/o  01/20/2013   *RADIOLOGY REPORT*  Clinical Data:  Chest pain.  Abdominal  pain.  CT ANGIOGRAPHY CHEST, ABDOMEN AND PELVIS  Technique:  Multidetector CT imaging through the chest, abdomen and pelvis was performed using the standard protocol during bolus administration of intravenous contrast.  Multiplanar reconstructed images including MIPs were obtained and reviewed to evaluate the vascular anatomy.  Contrast: OMNIPAQUE IOHEXOL 350 MG/ML SOLN  Comparison:  01/16/2013  CTA CHEST  Findings:  There is no evidence of aortic aneurysm, aortic dissection, or aortic transection.  Innominate artery, right subclavian artery, right common carotid artery, left common carotid artery, left subclavian artery, and bilateral vertebral arteries are patent.  No pericardial effusion.  No abnormal adenopathy by measurement criteria.  No obvious filling defect in the pulmonary arterial tree to suggest acute pulmonary thromboembolism.  There is mild wall thickening of the distal esophagus just above the GE junction.  No pneumothorax.  No pleural effusion.  Pleural-based 2.2 x 1.2 cm mass at the right apex associated with surrounding micronodules.  There are other irregular, triangular, and patchy opacities within the right upper lobe. 8 mm spiculated opacity in the left upper lobe towards the apex on image 37.  Dependent atelectasis at the lung bases.  No acute bony deformity.   Review of the MIP images confirms the above findings.  IMPRESSION: No evidence of aortic dissection.  Bilateral apical lung masses.  Primary considerations include malignancy and TB.  Small nodules and irregular opacities within the right upper lobe support the possibility of mycobacterium infection.  Correlate clinically as for the need for workup.  CTA ABDOMEN AND PELVIS  Findings:  No evidence of aortic dissection or aortic aneurysm.  Celiac, SMA, and IMA are patent.  Branch vessels are patent. Single renal arteries are patent.  Bilateral internal, external, and common iliac arteries are patent.  Multiple small foci of arterial  enhancement within the liver are present.  Given history of hepatitis C, hepatic cellular carcinoma is not excluded.  Gallbladder, pancreas, spleen, adrenal glands, kidneys are stable in appearance.  Appendix is not visualized.  Bladder is within normal limits.  No free fluid.  No obvious abnormal adenopathy.  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  Bilateral L5 nerve root encroachment at this level.   Review of the MIP images confirms the above findings.  IMPRESSION: No evidence of aortic dissection or acute arterial abnormality.  Multiple foci of arterial enhancement throughout the liver.  Given history of hepatitis C, hepatic cellular carcinoma is not excluded. MRI is recommended to further characterize.  Otherwise stable exam.   Original Report Authenticated By: Jolaine Click, M.D.    Jeoffrey Massed, MD  [facility] Pager:[phone]  If 7PM-7AM, please contact night-coverage [email or web address] Password [facility] 01/22/2013, 12:58 PM   LOS: 2 days

## 2013-01-22 NOTE — Progress Notes (Signed)
Pt transferred to [id number], per MD order. Report called to receiving nurse and all questions answered.

## 2013-01-22 NOTE — Progress Notes (Signed)
Adam Horton is a 47 y.o. male patient who transferred  From 3300 awake, alert  & orientated  X 3 limited [place] speaker, Camb[place]ian, No Order, VSS - Blood pressure 164/99, pulse 60, temperature 98.2 F (36.8 C), temperature source Oral, resp. rate 18, height 5\' 6"  (1.676 m), weight 60.6 kg (133 lb 9.6 oz), SpO2 99.00%., no c/o shortness of breath, no c/o chest pain, no distress noted. Tele #[id number] placed and pt is currently running:normal sinus rhythm.   IV site WDL: antecubital right, condition patent and no redness with a transparent dsg that's clean dry and intact.  Allergies:  No Known Allergies   Past Medical History  Diagnosis Date  . Diabetes mellitus without complication     Pt orientation to unit, room and routine. SR up x 2, fall risk assessment complete with Patientverbalizing understanding of risks associated with falls. Pt verbalizes an understanding of how to use the call bell and to call for help before getting out of bed.  Skin, clean-dry- intact without evidence of bruising, or skin tears.   No evidence of skin break down noted on exam.    Will cont to monitor and assist as needed.  Cindra Eves, RN 01/22/2013 7:48 PM

## 2013-01-22 NOTE — Progress Notes (Signed)
Pt requesting to speak with Chaplain and pray. Chaplain called and is now at the bedside. Will continue to monitor.

## 2013-01-23 DIAGNOSIS — J984 Other disorders of lung: Secondary | ICD-10-CM

## 2013-01-23 DIAGNOSIS — K7689 Other specified diseases of liver: Secondary | ICD-10-CM

## 2013-01-23 LAB — GLUCOSE, CAPILLARY
Glucose-Capillary: 107 mg/dL — ABNORMAL HIGH (ref 70–99)
Glucose-Capillary: 126 mg/dL — ABNORMAL HIGH (ref 70–99)
Glucose-Capillary: 151 mg/dL — ABNORMAL HIGH (ref 70–99)
Glucose-Capillary: 151 mg/dL — ABNORMAL HIGH (ref 70–99)

## 2013-01-23 LAB — CBC
MCHC: 33.4 g/dL (ref 30.0–36.0)
Platelets: 204 10*3/uL (ref 150–400)
RDW: 13.7 % (ref 11.5–15.5)
WBC: 4 10*3/uL (ref 4.0–10.5)

## 2013-01-23 LAB — BASIC METABOLIC PANEL
BUN: 12 mg/dL (ref 6–23)
Calcium: 9.1 mg/dL (ref 8.4–10.5)
Creatinine, Ser: 0.97 mg/dL (ref 0.50–1.35)
GFR calc Af Amer: >90 mL/min (ref >90)
GFR calc non Af Amer: >90 mL/min (ref >90)

## 2013-01-23 LAB — HEPATIC FUNCTION PANEL
ALT: 74 U/L — ABNORMAL HIGH (ref 0–53)
Alkaline Phosphatase: 67 U/L (ref 39–117)
Bilirubin, Direct: 0.1 mg/dL (ref 0.0–0.3)
Indirect Bilirubin: 0.4 mg/dL (ref 0.3–0.9)
Total Protein: 6.9 g/dL (ref 6.0–8.3)

## 2013-01-23 MED ORDER — INSULIN ASPART 100 UNIT/ML ~~LOC~~ SOLN
0.0000 [IU] | Freq: Three times a day (TID) | SUBCUTANEOUS | Status: DC
  Administered 2013-01-23: 2 [IU] via SUBCUTANEOUS
  Administered 2013-01-24: 3 [IU] via SUBCUTANEOUS
  Administered 2013-01-25 – 2013-01-26 (×4): 2 [IU] via SUBCUTANEOUS
  Administered 2013-01-27: 3 [IU] via SUBCUTANEOUS
  Administered 2013-01-27: 2 [IU] via SUBCUTANEOUS
  Administered 2013-01-28: 3 [IU] via SUBCUTANEOUS
  Administered 2013-01-28: 2 [IU] via SUBCUTANEOUS
  Administered 2013-01-28: 1 [IU] via SUBCUTANEOUS

## 2013-01-23 MED ORDER — INSULIN DETEMIR 100 UNIT/ML ~~LOC~~ SOLN
15.0000 [IU] | Freq: Every day | SUBCUTANEOUS | Status: DC
  Administered 2013-01-23 – 2013-01-27 (×5): 15 [IU] via SUBCUTANEOUS
  Filled 2013-01-23 (×6): qty 0.15

## 2013-01-23 MED ORDER — HYDROCORTISONE ACETATE 25 MG RE SUPP
25.0000 mg | Freq: Two times a day (BID) | RECTAL | Status: DC
  Administered 2013-01-23 – 2013-01-28 (×10): 25 mg via RECTAL
  Filled 2013-01-23 (×11): qty 1

## 2013-01-23 NOTE — ED Provider Notes (Signed)
Medical screening examination/treatment/procedure(s) were conducted as a shared visit with non-physician practitioner(s) and myself.  I personally evaluated the patient during the encounter  Pt with diffuse abdominal pain, has had 2 prior ED visits for same with mildly elevated LFT and Lipase. Hep panel positive for Hep C antibodies. Abdomen is tender, but non-surgical.   Charles B. Bernette Mayers, MD 01/23/13 1259

## 2013-01-23 NOTE — Progress Notes (Signed)
Rt came in around 5am to NTS.  RT explained to pt what the procedure would be and pt agreed to go forward.  RT was able to put catheter into pts nose only an inch/inch and a half and pt began to become aggressive and started to pull at the cath.  Rt removed for pt to recover and restart but pt would not let rt continue.  Rt explained importance of sputum sample, but it was hard to tell if pt understood due to language barrier.  Rt gave report to continue to encourage pt to cough up sputum for sample or to try to NTS again.

## 2013-01-23 NOTE — Progress Notes (Signed)
Utilization review completed.  P.J. Alizabeth Antonio,RN,BSN Case Manager [phone]  

## 2013-01-23 NOTE — Progress Notes (Signed)
Used Education officer, environmental ID [phone].  The patient stated he had severe pain in his abdomen 30 minutes after eating, as well as continuous pain on his left flank/lower back, and rectum.  Medications given to him for this pain was explained to him, and he stated that the combination of the protonix, oxy IR 5mg , GI cocktail, and the anusol suppository has given him the most relief since he has been admitted.  Patient was also informed of the importance of gaining a new sputum specimen whether by induction with hypertonic solution or NTS.  He agreed to allowing the RT try again in the morning.  This nurse assured the patient that the MD would be aware of this conversation.  Will continue to monitor patient.

## 2013-01-23 NOTE — Consult Note (Signed)
INFECTIOUS DISEASE CONSULT NOTE  Date of Admission:  01/20/2013  Date of Consult:  01/23/2013  Reason for Consult: cavitary lung lesions Referring Physician: Jerral Ralph  Impression/Recommendation Bilateral cavitary lung lesions Liver lesions  Would Send BCx for AFB Send quantiferon gold Ask respiratory to induce for sputum x 2 If he is unable to do this he needs BAL  Comment- TB would seem the most likely dx but certainly could have metastatic dz or primary lung CA. Hard to say with very limited hx on this pt.   Thank you so much for this interesting consult,   [name] [phone]  Adam Horton is an 47 y.o. male.  HPI:  47 yo [place] M with hx of DM comes to health system 5-23 with hx of abd pain, n/v. He denied cough on adm as well as fever. As part of his w/u he underwent CT angio which showed lesions in his liver and bilateral apical lung masses.  We are asked to see to eval for TB. He has been afebrile in hospital, WBC normal.   Past Medical History  Diagnosis Date  . Diabetes mellitus without complication     Past Surgical History  Procedure Laterality Date  . Appendectomy       No Known Allergies  Medications:  Scheduled: . insulin aspart  0-9 Units Subcutaneous TID WC  . insulin detemir  15 Units Subcutaneous QHS  . lisinopril  2.5 mg Oral Daily  . pantoprazole (PROTONIX) IV  40 mg Intravenous Q12H  . sodium chloride  3 mL Intravenous Q12H    Total days of antibiotics: 0          Social History:  reports that he has never smoked. He does not have any smokeless tobacco history on file. He reports that he does not drink alcohol or use illicit drugs.  History reviewed. No pertinent family history.  General ROS: see HPI, pt speaks little english  Blood pressure 119/71, pulse 60, temperature 97.5 F (36.4 C), temperature source Oral, resp. rate 20, height 5\' 6"  (1.676 m), weight 60.6 kg (133 lb 9.6 oz), SpO2 98.00%. General appearance: alert,  cooperative and no distress Eyes: negative findings: pupils equal, round, reactive to light and accomodation Throat: normal findings: oropharynx pink & moist without lesions or evidence of thrush Neck: no adenopathy and supple, symmetrical, trachea midline Lungs: diminished breath sounds apex - bilateral Heart: regular rate and rhythm Abdomen: normal findings: bowel sounds normal and soft, non-tender Extremities: edema none Lymph nodes: Cervical, supraclavicular, and axillary nodes normal.   Results for orders placed during the hospital encounter of 01/20/13 (from the past 48 hour(s))  AFB CULTURE WITH SMEAR     Status: None   Collection Time    01/21/13  2:47 PM      Result Value Range   Specimen Description GASTRIC ASPIRATE     Special Requests NONE     ACID FAST SMEAR NO ACID FAST BACILLI SEEN     Culture       Value: CULTURE WILL BE EXAMINED FOR 6 WEEKS BEFORE ISSUING A FINAL REPORT   Report Status PENDING    GLUCOSE, CAPILLARY     Status: None   Collection Time    01/21/13  4:40 PM      Result Value Range   Glucose-Capillary 91  70 - 99 mg/dL   Comment 1 Notify RN    GLUCOSE, CAPILLARY     Status: Abnormal   Collection Time    01/21/13  9:51 PM      Result Value Range   Glucose-Capillary 195 (*) 70 - 99 mg/dL   Comment 1 Notify RN    GLUCOSE, CAPILLARY     Status: Abnormal   Collection Time    01/22/13  7:57 AM      Result Value Range   Glucose-Capillary 114 (*) 70 - 99 mg/dL   Comment 1 Notify RN    GLUCOSE, CAPILLARY     Status: Abnormal   Collection Time    01/22/13 11:15 AM      Result Value Range   Glucose-Capillary 157 (*) 70 - 99 mg/dL   Comment 1 Notify RN    GLUCOSE, CAPILLARY     Status: Abnormal   Collection Time    01/22/13  5:25 PM      Result Value Range   Glucose-Capillary 172 (*) 70 - 99 mg/dL  GLUCOSE, CAPILLARY     Status: Abnormal   Collection Time    01/22/13  9:10 PM      Result Value Range   Glucose-Capillary 173 (*) 70 - 99 mg/dL    HEPATIC FUNCTION PANEL     Status: Abnormal   Collection Time    01/23/13  6:10 AM      Result Value Range   Total Protein 6.9  6.0 - 8.3 g/dL   Albumin 3.5  3.5 - 5.2 g/dL   AST 49 (*) 0 - 37 U/L   ALT 74 (*) 0 - 53 U/L   Alkaline Phosphatase 67  39 - 117 U/L   Total Bilirubin 0.5  0.3 - 1.2 mg/dL   Bilirubin, Direct 0.1  0.0 - 0.3 mg/dL   Indirect Bilirubin 0.4  0.3 - 0.9 mg/dL  BASIC METABOLIC PANEL     Status: Abnormal   Collection Time    01/23/13  6:10 AM      Result Value Range   Sodium 142  135 - 145 mEq/L   Potassium 3.5  3.5 - 5.1 mEq/L   Chloride 108  96 - 112 mEq/L   CO2 21  19 - 32 mEq/L   Glucose, Bld 101 (*) 70 - 99 mg/dL   BUN 12  6 - 23 mg/dL   Creatinine, Ser 1.61  0.50 - 1.35 mg/dL   Calcium 9.1  8.4 - 09.6 mg/dL   GFR calc non Af Amer >90  >90 mL/min   GFR calc Af Amer >90  >90 mL/min   Comment:            The eGFR has been calculated     using the CKD EPI equation.     This calculation has not been     validated in all clinical     situations.     eGFR's persistently     <90 mL/min signify     possible Chronic Kidney Disease.  CBC     Status: Abnormal   Collection Time    01/23/13  6:10 AM      Result Value Range   WBC 4.0  4.0 - 10.5 K/uL   RBC 5.46  4.22 - 5.81 MIL/uL   Hemoglobin 13.0  13.0 - 17.0 g/dL   HCT 04.5 (*) 40.9 - 81.1 %   MCV 71.2 (*) 78.0 - 100.0 fL   MCH 23.8 (*) 26.0 - 34.0 pg   MCHC 33.4  30.0 - 36.0 g/dL   RDW 91.4  78.2 - 95.6 %   Platelets 204  150 - 400  K/uL  GLUCOSE, CAPILLARY     Status: Abnormal   Collection Time    01/23/13  8:37 AM      Result Value Range   Glucose-Capillary 107 (*) 70 - 99 mg/dL  GLUCOSE, CAPILLARY     Status: Abnormal   Collection Time    01/23/13 12:05 PM      Result Value Range   Glucose-Capillary 126 (*) 70 - 99 mg/dL      Component Value Date/Time   SDES GASTRIC ASPIRATE 01/21/2013 1447   SPECREQUEST NONE 01/21/2013 1447   CULT CULTURE WILL BE EXAMINED FOR 6 WEEKS BEFORE ISSUING A  FINAL REPORT 01/21/2013 1447   REPTSTATUS PENDING 01/21/2013 1447   No results found. Recent Results (from the past 240 hour(s))  CULTURE, BLOOD (ROUTINE X 2)     Status: None   Collection Time    01/14/13  1:25 PM      Result Value Range Status   Specimen Description BLOOD LEFT ANTECUBITAL   Final   Special Requests BOTTLES DRAWN AEROBIC AND ANAEROBIC 3CC   Final   Culture  Setup Time 01/14/2013 19:00   Final   Culture NO GROWTH 5 DAYS   Final   Report Status 01/20/2013 FINAL   Final  CULTURE, BLOOD (ROUTINE X 2)     Status: None   Collection Time    01/14/13  1:31 PM      Result Value Range Status   Specimen Description BLOOD LEFT HAND   Final   Special Requests BOTTLES DRAWN AEROBIC ONLY 3CC   Final   Culture  Setup Time 01/14/2013 19:00   Final   Culture NO GROWTH 5 DAYS   Final   Report Status 01/20/2013 FINAL   Final  MRSA PCR SCREENING     Status: None   Collection Time    01/20/13  8:16 PM      Result Value Range Status   MRSA by PCR NEGATIVE  NEGATIVE Final   Comment:            The GeneXpert MRSA Assay (FDA     approved for NASAL specimens     only), is one component of a     comprehensive MRSA colonization     surveillance program. It is not     intended to diagnose MRSA     infection nor to guide or     monitor treatment for     MRSA infections.  AFB CULTURE WITH SMEAR     Status: None   Collection Time    01/21/13  2:47 PM      Result Value Range Status   Specimen Description GASTRIC ASPIRATE   Final   Special Requests NONE   Final   ACID FAST SMEAR NO ACID FAST BACILLI SEEN   Final   Culture     Final   Value: CULTURE WILL BE EXAMINED FOR 6 WEEKS BEFORE ISSUING A FINAL REPORT   Report Status PENDING   Incomplete      01/23/2013, 1:39 PM     LOS: 3 days   [email or web address]

## 2013-01-23 NOTE — Progress Notes (Signed)
RT note: Pt. given multiple hypertonic saline nebs via mask, placed sputum collection device in room for X1 collection AFB, pt. only able to cough up small amount foamy white sputum after neb, had dry non-productive cough prior to procedure,  labelled/sent to lab.

## 2013-01-23 NOTE — Progress Notes (Signed)
PATIENT DETAILS Name: Adam Horton Age: 47 y.o. Sex: male Date of Birth: 30-Oct-1965 Admit Date: 01/20/2013 Admitting Physician Dewayne Shorter Levora Dredge, MD PCP:No PCP Per Patient  Subjective: Abdominal pain better, tolerating full liquids-claims to have minimal hematochezia this am.  Assessment/Plan: Principal Problem:   Abdominal pain -etiology remains unclear-but clearly better and continues to improve daily -EGD 5/24-showed gastritis-do not think this still explains his abdominal pain -CT Angio Chest/Abdomen-no acute abnormalities to explain this amount of pain. -check ESR only 8, so doubt a chronic inflammatory condition -??anxiety was playing a role -Minimize Narcotics  Active Problems: B/L upper lobe lesion in lung -?TB -c/w Airborne isolation - Gold Quantiferon pending, but gastric aspirate neg for AFB, unable to induce sputum -HIV negative -he has been in the [place] for the past 12 years, claims no hx of prior TB while in [place], claims that a PPD done during his immigration here was negative -have consulted ID  ? Hep C -await Viral load -LFT's stable    DM (diabetes mellitus) -CBG's stable -c/w SSI -resumed Levemir at reduced dose-will increase to 15 units    HTN (hypertension) -resume Lisinopril  Disposition: Remain inpatient-transfer to med-surg  DVT Prophylaxis:  SCD's  Code Status: Full code   Family Communication None at bedside  Procedures:  None  CONSULTS:  GI   MEDICATIONS: Scheduled Meds: . insulin aspart  0-9 Units Subcutaneous TID WC  . insulin detemir  10 Units Subcutaneous QHS  . lisinopril  2.5 mg Oral Daily  . pantoprazole (PROTONIX) IV  40 mg Intravenous Q12H  . sodium chloride  3 mL Intravenous Q12H   Continuous Infusions: . sodium chloride 75 mL/hr at 01/22/13 1707   PRN Meds:.acetaminophen, acetaminophen, albuterol, diphenhydrAMINE, gi cocktail, ondansetron (ZOFRAN) IV, ondansetron, oxyCODONE, sodium chloride  HYPERTONIC  Antibiotics: Anti-infectives   None       PHYSICAL EXAM: Vital signs in last 24 hours: Filed Vitals:   01/22/13 1545 01/22/13 2113 01/22/13 2216 01/23/13 0445  BP: 164/99 147/84  119/71  Pulse: 60 68  60  Temp: 98.2 F (36.8 C) 97.8 F (36.6 C)  97.5 F (36.4 C)  TempSrc: Oral Oral  Oral  Resp: 18 20  20   Height:      Weight:      SpO2: 99% 100% 95% 98%    Weight change:  Filed Weights   01/20/13 2000  Weight: 60.6 kg (133 lb 9.6 oz)   Body mass index is 21.57 kg/(m^2).   Gen Exam: Awake and alert with clear speech.   Neck: Supple, No JVD.   Chest: B/L Clear.   CVS: S1 S2 Regular, no murmurs.  Abdomen: soft, BS +, mildly tender in epigastric, non distended. No rebound Extremities: no edema, lower extremities warm to touch Neurologic: Non Focal.   Skin: No Rash.   Wounds: N/A.    Intake/Output from previous day:  Intake/Output Summary (Last 24 hours) at 01/23/13 0835 Last data filed at 01/22/13 2115  Gross per 24 hour  Intake 878.75 ml  Output   1625 ml  Net -746.25 ml     LAB RESULTS: CBC  Recent Labs Lab 01/20/13 1409 01/21/13 0520 01/23/13 0610  WBC 4.6 4.7 4.0  HGB 14.3 12.8* 13.0  HCT 41.6 38.5* 38.9*  PLT 248 230 204  MCV 71.2* 72.0* 71.2*  MCH 24.5* 23.9* 23.8*  MCHC 34.4 33.2 33.4  RDW 13.6 13.6 13.7  LYMPHSABS 1.0  --   --   MONOABS 0.1  --   --  EOSABS 0.0  --   --   BASOSABS 0.0  --   --     Chemistries   Recent Labs Lab 01/20/13 1409 01/21/13 0520 01/23/13 0610  NA 131* 140 142  K 5.2* 3.6 3.5  CL 98 104 108  CO2 21 24 21   GLUCOSE 254* 117* 101*  BUN 10 8 12   CREATININE 1.04 1.06 0.97  CALCIUM 9.7 8.7 9.1    CBG:  Recent Labs Lab 01/21/13 2151 01/22/13 0757 01/22/13 1115 01/22/13 1725 01/22/13 2110  GLUCAP 195* 114* 157* 172* 173*    GFR Estimated Creatinine Clearance: 81.6 ml/min (by C-G formula based on Cr of 0.97).  Coagulation profile No results found for this basename: INR,  PROTIME,  in the last 168 hours  Cardiac Enzymes  Recent Labs Lab 01/20/13 1825 01/20/13 2343 01/21/13 0520  TROPONINI <0.30 <0.30 <0.30    No components found with this basename: POCBNP,  No results found for this basename: DDIMER,  in the last 72 hours No results found for this basename: HGBA1C,  in the last 72 hours No results found for this basename: CHOL, HDL, LDLCALC, TRIG, CHOLHDL, LDLDIRECT,  in the last 72 hours No results found for this basename: TSH, T4TOTAL, FREET3, T3FREE, THYROIDAB,  in the last 72 hours No results found for this basename: VITAMINB12, FOLATE, FERRITIN, TIBC, IRON, RETICCTPCT,  in the last 72 hours  Recent Labs  01/20/13 1409 01/21/13 0820  LIPASE 82* 47    Urine Studies No results found for this basename: UACOL, UAPR, USPG, UPH, UTP, UGL, UKET, UBIL, UHGB, UNIT, UROB, ULEU, UEPI, UWBC, URBC, UBAC, CAST, CRYS, UCOM, BILUA,  in the last 72 hours  MICROBIOLOGY: Recent Results (from the past 240 hour(s))  CULTURE, BLOOD (ROUTINE X 2)     Status: None   Collection Time    01/14/13  1:25 PM      Result Value Range Status   Specimen Description BLOOD LEFT ANTECUBITAL   Final   Special Requests BOTTLES DRAWN AEROBIC AND ANAEROBIC 3CC   Final   Culture  Setup Time 01/14/2013 19:00   Final   Culture NO GROWTH 5 DAYS   Final   Report Status 01/20/2013 FINAL   Final  CULTURE, BLOOD (ROUTINE X 2)     Status: None   Collection Time    01/14/13  1:31 PM      Result Value Range Status   Specimen Description BLOOD LEFT HAND   Final   Special Requests BOTTLES DRAWN AEROBIC ONLY 3CC   Final   Culture  Setup Time 01/14/2013 19:00   Final   Culture NO GROWTH 5 DAYS   Final   Report Status 01/20/2013 FINAL   Final  MRSA PCR SCREENING     Status: None   Collection Time    01/20/13  8:16 PM      Result Value Range Status   MRSA by PCR NEGATIVE  NEGATIVE Final   Comment:            The GeneXpert MRSA Assay (FDA     approved for NASAL specimens     only),  is one component of a     comprehensive MRSA colonization     surveillance program. It is not     intended to diagnose MRSA     infection nor to guide or     monitor treatment for     MRSA infections.  AFB CULTURE WITH SMEAR     Status:  None   Collection Time    01/21/13  2:47 PM      Result Value Range Status   Specimen Description GASTRIC ASPIRATE   Final   Special Requests NONE   Final   ACID FAST SMEAR NO ACID FAST BACILLI SEEN   Final   Culture     Final   Value: CULTURE WILL BE EXAMINED FOR 6 WEEKS BEFORE ISSUING A FINAL REPORT   Report Status PENDING   Incomplete    RADIOLOGY STUDIES/RESULTS: Ct Angio Chest Pe W/cm &/or Wo Cm  01/20/2013   *RADIOLOGY REPORT*  Clinical Data:  Chest pain.  Abdominal pain.  CT ANGIOGRAPHY CHEST, ABDOMEN AND PELVIS  Technique:  Multidetector CT imaging through the chest, abdomen and pelvis was performed using the standard protocol during bolus administration of intravenous contrast.  Multiplanar reconstructed images including MIPs were obtained and reviewed to evaluate the vascular anatomy.  Contrast: OMNIPAQUE IOHEXOL 350 MG/ML SOLN  Comparison:  01/16/2013  CTA CHEST  Findings:  There is no evidence of aortic aneurysm, aortic dissection, or aortic transection.  Innominate artery, right subclavian artery, right common carotid artery, left common carotid artery, left subclavian artery, and bilateral vertebral arteries are patent.  No pericardial effusion.  No abnormal adenopathy by measurement criteria.  No obvious filling defect in the pulmonary arterial tree to suggest acute pulmonary thromboembolism.  There is mild wall thickening of the distal esophagus just above the GE junction.  No pneumothorax.  No pleural effusion.  Pleural-based 2.2 x 1.2 cm mass at the right apex associated with surrounding micronodules.  There are other irregular, triangular, and patchy opacities within the right upper lobe. 8 mm spiculated opacity in the left upper lobe  towards the apex on image 37.  Dependent atelectasis at the lung bases.  No acute bony deformity.   Review of the MIP images confirms the above findings.  IMPRESSION: No evidence of aortic dissection.  Bilateral apical lung masses.  Primary considerations include malignancy and TB.  Small nodules and irregular opacities within the right upper lobe support the possibility of mycobacterium infection.  Correlate clinically as for the need for workup.  CTA ABDOMEN AND PELVIS  Findings:  No evidence of aortic dissection or aortic aneurysm.  Celiac, SMA, and IMA are patent.  Branch vessels are patent. Single renal arteries are patent.  Bilateral internal, external, and common iliac arteries are patent.  Multiple small foci of arterial enhancement within the liver are present.  Given history of hepatitis C, hepatic cellular carcinoma is not excluded.  Gallbladder, pancreas, spleen, adrenal glands, kidneys are stable in appearance.  Appendix is not visualized.  Bladder is within normal limits.  No free fluid.  No obvious abnormal adenopathy.  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  Bilateral L5 nerve root encroachment at this level.   Review of the MIP images confirms the above findings.  IMPRESSION: No evidence of aortic dissection or acute arterial abnormality.  Multiple foci of arterial enhancement throughout the liver.  Given history of hepatitis C, hepatic cellular carcinoma is not excluded. MRI is recommended to further characterize.  Otherwise stable exam.   Original Report Authenticated By: Jolaine Click, M.D.   US Abdomen Complete  01/16/2013   *RADIOLOGY REPORT*  Clinical Data:  Lower quadrant abdominal pain.  COMPLETE ABDOMINAL ULTRASOUND  Comparison:  CT scan 01/14/2013.  Findings:  Gallbladder:  Echogenic sludge noted layering in the gallbladder. No gallbladder wall thickening, pericholecystic fluid or definite gallstones.  Common bile duct:  Normal in caliber measuring a maximum of 4.8mm.   Liver:  There is diffuse increased echogenicity of the liver and decreased through transmission consistent with fatty infiltration. No focal lesions or biliary dilatation.  IVC:  Normal caliber.  Pancreas:  Not well visualized due to overlying bowel gas but was normal on the recent CT scan.  Spleen:  Normal size and echogenicity without focal lesions.  Right Kidney:  10.0 cm in length. Normal renal cortical thickness and echogenicity without focal lesions or hydronephrosis.  Left Kidney:  10.8 cm in length. Normal renal cortical thickness and echogenicity without focal lesions or hydronephrosis.  Abdominal aorta:  Normal.  IMPRESSION:  1.  Layering echogenic sludge in the gallbladder but no definite gallstones or findings for acute cholecystitis. 2.  Normal caliber common bile duct. 3.  Diffuse fatty infiltration of the liver. 4.  Poor visualization of the pancreas.   Original Report Authenticated By: Rudie Meyer, M.D.   Ct Abdomen Pelvis W Contrast  01/16/2013   *RADIOLOGY REPORT*  Clinical Data: Epigastric pain.  CT ABDOMEN AND PELVIS WITH CONTRAST  Technique:  Multidetector CT imaging of the abdomen and pelvis was performed following the standard protocol during bolus administration of intravenous contrast.  Contrast: OMNIPAQUE IOHEXOL 300 MG/ML  SOLN  Comparison: 01/14/2013.  Findings: The lung bases are clear except for dependent atelectasis.  Mild diffuse fatty infiltration of the liver but no focal hepatic lesions or intrahepatic biliary dilatation.  Gallbladder is grossly normal.  No common bile duct dilatation.  The pancreas is normal. The spleen is normal.  The adrenal glands and kidneys are normal.  The stomach, duodenum, small bowel and colon are unremarkable.  No inflammatory changes or mass lesions.  The appendix is surgically absent.  No mesenteric or retroperitoneal mass or adenopathy.  The aorta is normal in caliber.  The major branch vessels are patent.  The bladder is distended almost to  the level of the umbilicus.  No pelvic mass, adenopathy or free pelvic fluid collections.  The prostate gland and seminal vesicles are unremarkable.  No inguinal mass or hernia.  The bony pelvis is intact.  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  IMPRESSION:  1.  Distended bladder almost to the level of the umbilicus. 2.  No other significant abdominal/pelvic findings demonstrated.   Original Report Authenticated By: Rudie Meyer, M.D.   Ct Abdomen Pelvis W Contrast  01/14/2013   *RADIOLOGY REPORT*  Clinical Data: Chest pain.  Abdominal pain.  CT ABDOMEN AND PELVIS WITH CONTRAST  Technique:  Multidetector CT imaging of the abdomen and pelvis was performed following the standard protocol during bolus administration of intravenous contrast.  Contrast: OMNIPAQUE IOHEXOL 300 MG/ML  SOLN, 50mL OMNIPAQUE IOHEXOL 300 MG/ML  SOLN  Comparison: 09/19/2012  Findings: Diffuse hepatic steatosis.  Gallbladder, spleen, pancreas, adrenal glands, kidneys are within normal limits.  Bladder is distended.  Unremarkable prostate.  No free fluid.  No abnormal adenopathy.  Post appendectomy clips.  Grade 1 L5-S1 spondylolisthesis.  IMPRESSION: No acute intra-abdominal or intrapelvic pathology.  Stable.   Original Report Authenticated By: Jolaine Click, M.D.   Dg Chest Portable 1 View  01/14/2013   *RADIOLOGY REPORT*  Clinical Data: Short of breath and cough  PORTABLE CHEST - 1 VIEW  Comparison: 11/21/2010  Findings: Heart is normal in size.  Lungs are clear.  No pleural effusion.  No pneumothorax.  No acute bony deformity.  IMPRESSION: No active cardiopulmonary disease.   Original Report Authenticated By: Jolaine Click, M.D.   Dg Abd Acute W/chest  01/20/2013   *RADIOLOGY REPORT*  Clinical Data: Abdominal pain  ACUTE ABDOMEN SERIES (ABDOMEN 2 VIEW & CHEST 1 VIEW)  Comparison: CT abdomen pelvis dated 01/16/2013  Findings: Lungs are essentially clear.  No focal consolidation.  No pleural  effusion or pneumothorax.  Nonobstructive bowel gas pattern.  No evidence of free air under the diaphragm on the upright view.  Visualized osseous structures are within normal limits.  IMPRESSION: No evidence of acute cardiopulmonary disease.  No evidence of small bowel obstruction or free air.   Original Report Authenticated By: Charline Bills, M.D.   Ct Angio Abd/pel W/ And/or W/o  01/20/2013   *RADIOLOGY REPORT*  Clinical Data:  Chest pain.  Abdominal pain.  CT ANGIOGRAPHY CHEST, ABDOMEN AND PELVIS  Technique:  Multidetector CT imaging through the chest, abdomen and pelvis was performed using the standard protocol during bolus administration of intravenous contrast.  Multiplanar reconstructed images including MIPs were obtained and reviewed to evaluate the vascular anatomy.  Contrast: OMNIPAQUE IOHEXOL 350 MG/ML SOLN  Comparison:  01/16/2013  CTA CHEST  Findings:  There is no evidence of aortic aneurysm, aortic dissection, or aortic transection.  Innominate artery, right subclavian artery, right common carotid artery, left common carotid artery, left subclavian artery, and bilateral vertebral arteries are patent.  No pericardial effusion.  No abnormal adenopathy by measurement criteria.  No obvious filling defect in the pulmonary arterial tree to suggest acute pulmonary thromboembolism.  There is mild wall thickening of the distal esophagus just above the GE junction.  No pneumothorax.  No pleural effusion.  Pleural-based 2.2 x 1.2 cm mass at the right apex associated with surrounding micronodules.  There are other irregular, triangular, and patchy opacities within the right upper lobe. 8 mm spiculated opacity in the left upper lobe towards the apex on image 37.  Dependent atelectasis at the lung bases.  No acute bony deformity.   Review of the MIP images confirms the above findings.  IMPRESSION: No evidence of aortic dissection.  Bilateral apical lung masses.  Primary considerations include malignancy  and TB.  Small nodules and irregular opacities within the right upper lobe support the possibility of mycobacterium infection.  Correlate clinically as for the need for workup.  CTA ABDOMEN AND PELVIS  Findings:  No evidence of aortic dissection or aortic aneurysm.  Celiac, SMA, and IMA are patent.  Branch vessels are patent. Single renal arteries are patent.  Bilateral internal, external, and common iliac arteries are patent.  Multiple small foci of arterial enhancement within the liver are present.  Given history of hepatitis C, hepatic cellular carcinoma is not excluded.  Gallbladder, pancreas, spleen, adrenal glands, kidneys are stable in appearance.  Appendix is not visualized.  Bladder is within normal limits.  No free fluid.  No obvious abnormal adenopathy.  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  Bilateral L5 nerve root encroachment at this level.   Review of the MIP images confirms the above findings.  IMPRESSION: No evidence of aortic dissection or acute arterial abnormality.  Multiple foci of arterial enhancement throughout the liver.  Given history of hepatitis C, hepatic cellular carcinoma is not excluded. MRI is recommended to further characterize.  Otherwise stable exam.   Original Report Authenticated By: Jolaine Click, M.D.    Jeoffrey Massed, MD  [facility] Pager:[phone]  If 7PM-7AM, please contact night-coverage [email or web address] Password  [email or web address] 01/23/2013, 8:35 AM   LOS: 3 days

## 2013-01-24 LAB — GLUCOSE, CAPILLARY
Glucose-Capillary: 112 mg/dL — ABNORMAL HIGH (ref 70–99)
Glucose-Capillary: 223 mg/dL — ABNORMAL HIGH (ref 70–99)
Glucose-Capillary: 91 mg/dL (ref 70–99)

## 2013-01-24 LAB — HEPATITIS C VRS RNA DETECT BY PCR-QUAL: Hepatitis C Vrs RNA by PCR-Qual: POSITIVE — AB

## 2013-01-24 MED ORDER — ENOXAPARIN SODIUM 40 MG/0.4ML ~~LOC~~ SOLN
40.0000 mg | SUBCUTANEOUS | Status: DC
  Administered 2013-01-24 – 2013-01-28 (×5): 40 mg via SUBCUTANEOUS
  Filled 2013-01-24 (×5): qty 0.4

## 2013-01-24 MED ORDER — PNEUMOCOCCAL VAC POLYVALENT 25 MCG/0.5ML IJ INJ
0.5000 mL | INJECTION | INTRAMUSCULAR | Status: AC
  Administered 2013-01-25: 0.5 mL via INTRAMUSCULAR
  Filled 2013-01-24: qty 0.5

## 2013-01-24 MED ORDER — PANTOPRAZOLE SODIUM 40 MG PO TBEC
40.0000 mg | DELAYED_RELEASE_TABLET | Freq: Two times a day (BID) | ORAL | Status: DC
  Administered 2013-01-24 – 2013-01-28 (×9): 40 mg via ORAL
  Filled 2013-01-24 (×9): qty 1

## 2013-01-24 MED FILL — Sodium Chloride Soln Nebu 3%: RESPIRATORY_TRACT | Qty: 15 | Status: AC

## 2013-01-24 NOTE — Progress Notes (Signed)
RT Note: Hypertonic HHN given. Pt expectorated small, thin, clear, secretions, sent to lab for culture.

## 2013-01-24 NOTE — Progress Notes (Addendum)
PATIENT DETAILS Name: Adam Horton Age: 47 y.o. Sex: male Date of Birth: 10/12/1965 Admit Date: 01/20/2013 Admitting Physician Dewayne Shorter Levora Dredge, MD PCP:No PCP Per Patient  Brief summary Patient is a 47 year old [place] male with a past medical history of hypertension, diabetes,? Hepatitis C who presented to the emergency room on 5/23 with diffuse abdominal and chest pain for a week. This was his third emergency room visit. He already had 2 CT scans of his abdomen prior to this visit. Given ongoing chest and abdominal pain, patient was admitted to the step down unit, he had elevated lactic acid levels on admission therefore a CT angiogram of the chest and abdomen was pursued which showed a normal vascular anatomy, however the CTA of the chest showed bilateral pulmonary apical lesions consistent with perhaps tuberculosis. GI was consulted, EGD was positive for gastritis however it was felt that this was not responsible for his severe pain. His pain has improved with supportive care including PPI, he is currently on airborne isolation and we are awaiting sputum AFB.Infectious disease has been consulted  Subjective: Abdominal pain better, tolerating full liquids-claims to have minimal hematochezia this am. Infectious disease has been consulted  Assessment/Plan: Principal Problem:   Abdominal pain -etiology remains unclear-but clearly better and continues to improve daily. Did have mild lipase elevation on admission, however suspect pancreas not to be the etiology  -EGD 5/24-showed gastritis-do not think this still explains his abdominal pain -CT Angio Chest/Abdomen-no acute abnormalities to explain this amount of pain. -check ESR only 8, so doubt a chronic inflammatory condition -??anxiety was playing a role -Minimize Narcotics  Active Problems: B/L upper lobe lesion in lung -?TB -c/w Airborne isolation, sputum AFB currently pending - Gold Quantiferon pending, - but gastric aspirate neg  for AFB -HIV negative -he has been in the [place] for the past 12 years, claims no hx of prior TB while in [place], claims that a PPD done during his immigration here was negative -have consulted ID  Intermittent minimal rectal bleeding - Suspected hemorrhoidal etiology- on Anusol - Check CBC in a.m.  ? Hep C -await Viral load -LFT's stable    DM (diabetes mellitus) -CBG's stable -c/w SSI -resumed Levemir at reduced dose-will increase to 15 units    HTN (hypertension) -resume Lisinopril  Disposition: Remain inpatient  DVT Prophylaxis: Start prophylactic Lovenox  Code Status: Full code   Family Communication None at bedside  Procedures:  None  CONSULTS:  GI   MEDICATIONS: Scheduled Meds: . hydrocortisone  25 mg Rectal BID  . insulin aspart  0-9 Units Subcutaneous TID WC  . insulin detemir  15 Units Subcutaneous QHS  . lisinopril  2.5 mg Oral Daily  . pantoprazole  40 mg Oral BID AC  . sodium chloride  3 mL Intravenous Q12H   Continuous Infusions: . sodium chloride 10 mL/hr (01/23/13 1458)   PRN Meds:.acetaminophen, acetaminophen, albuterol, diphenhydrAMINE, gi cocktail, ondansetron (ZOFRAN) IV, ondansetron, oxyCODONE, sodium chloride HYPERTONIC  Antibiotics: Anti-infectives   None       PHYSICAL EXAM: Vital signs in last 24 hours: Filed Vitals:   01/23/13 1546 01/23/13 2058 01/24/13 0519 01/24/13 1022  BP:  157/91 121/77 128/80  Pulse:  59 59   Temp:  98.3 F (36.8 C) 97.8 F (36.6 C)   TempSrc:  Oral Oral   Resp:  20 18   Height:      Weight:      SpO2: 99% 100% 100%     Weight change:  [employer]  01/20/13 2000  Weight: 60.6 kg (133 lb 9.6 oz)   Body mass index is 21.57 kg/(m^2).   Gen Exam: Awake and alert with clear speech.   Neck: Supple, No JVD.   Chest: B/L Clear.   CVS: S1 S2 Regular, no murmurs.  Abdomen: soft, BS +, mildly tender in epigastric, non distended. No rebound Extremities: no edema, lower extremities warm to  touch Neurologic: Non Focal.   Skin: No Rash.   Wounds: N/A.    Intake/Output from previous day:  Intake/Output Summary (Last 24 hours) at 01/24/13 1136 Last data filed at 01/24/13 0600  Gross per 24 hour  Intake 2014.08 ml  Output    800 ml  Net 1214.08 ml     LAB RESULTS: CBC  Recent Labs Lab 01/20/13 1409 01/21/13 0520 01/23/13 0610  WBC 4.6 4.7 4.0  HGB 14.3 12.8* 13.0  HCT 41.6 38.5* 38.9*  PLT 248 230 204  MCV 71.2* 72.0* 71.2*  MCH 24.5* 23.9* 23.8*  MCHC 34.4 33.2 33.4  RDW 13.6 13.6 13.7  LYMPHSABS 1.0  --   --   MONOABS 0.1  --   --   EOSABS 0.0  --   --   BASOSABS 0.0  --   --     Chemistries   Recent Labs Lab 01/20/13 1409 01/21/13 0520 01/23/13 0610  NA 131* 140 142  K 5.2* 3.6 3.5  CL 98 104 108  CO2 21 24 21   GLUCOSE 254* 117* 101*  BUN 10 8 12   CREATININE 1.04 1.06 0.97  CALCIUM 9.7 8.7 9.1    CBG:  Recent Labs Lab 01/23/13 0837 01/23/13 1205 01/23/13 1640 01/23/13 2141 01/24/13 0847  GLUCAP 107* 126* 151* 151* 112*    GFR Estimated Creatinine Clearance: 81.6 ml/min (by C-G formula based on Cr of 0.97).  Coagulation profile No results found for this basename: INR, PROTIME,  in the last 168 hours  Cardiac Enzymes  Recent Labs Lab 01/20/13 1825 01/20/13 2343 01/21/13 0520  TROPONINI <0.30 <0.30 <0.30    No components found with this basename: POCBNP,  No results found for this basename: DDIMER,  in the last 72 hours No results found for this basename: HGBA1C,  in the last 72 hours No results found for this basename: CHOL, HDL, LDLCALC, TRIG, CHOLHDL, LDLDIRECT,  in the last 72 hours No results found for this basename: TSH, T4TOTAL, FREET3, T3FREE, THYROIDAB,  in the last 72 hours No results found for this basename: VITAMINB12, FOLATE, FERRITIN, TIBC, IRON, RETICCTPCT,  in the last 72 hours No results found for this basename: LIPASE, AMYLASE,  in the last 72 hours  Urine Studies No results found for this  basename: UACOL, UAPR, USPG, UPH, UTP, UGL, UKET, UBIL, UHGB, UNIT, UROB, ULEU, UEPI, UWBC, URBC, UBAC, CAST, CRYS, UCOM, BILUA,  in the last 72 hours  MICROBIOLOGY: Recent Results (from the past 240 hour(s))  CULTURE, BLOOD (ROUTINE X 2)     Status: None   Collection Time    01/14/13  1:25 PM      Result Value Range Status   Specimen Description BLOOD LEFT ANTECUBITAL   Final   Special Requests BOTTLES DRAWN AEROBIC AND ANAEROBIC 3CC   Final   Culture  Setup Time 01/14/2013 19:00   Final   Culture NO GROWTH 5 DAYS   Final   Report Status 01/20/2013 FINAL   Final  CULTURE, BLOOD (ROUTINE X 2)     Status: None   Collection Time  01/14/13  1:31 PM      Result Value Range Status   Specimen Description BLOOD LEFT HAND   Final   Special Requests BOTTLES DRAWN AEROBIC ONLY 3CC   Final   Culture  Setup Time 01/14/2013 19:00   Final   Culture NO GROWTH 5 DAYS   Final   Report Status 01/20/2013 FINAL   Final  MRSA PCR SCREENING     Status: None   Collection Time    01/20/13  8:16 PM      Result Value Range Status   MRSA by PCR NEGATIVE  NEGATIVE Final   Comment:            The GeneXpert MRSA Assay (FDA     approved for NASAL specimens     only), is one component of a     comprehensive MRSA colonization     surveillance program. It is not     intended to diagnose MRSA     infection nor to guide or     monitor treatment for     MRSA infections.  AFB CULTURE WITH SMEAR     Status: None   Collection Time    01/21/13  2:47 PM      Result Value Range Status   Specimen Description GASTRIC ASPIRATE   Final   Special Requests NONE   Final   ACID FAST SMEAR NO ACID FAST BACILLI SEEN   Final   Culture     Final   Value: CULTURE WILL BE EXAMINED FOR 6 WEEKS BEFORE ISSUING A FINAL REPORT   Report Status PENDING   Incomplete  AFB CULTURE, BLOOD     Status: None   Collection Time    01/23/13  3:00 PM      Result Value Range Status   Specimen Description BLOOD LEFT ARM   Final   Special  Requests 4CC   Final   Culture     Final   Value: CULTURE WILL BE EXAMINED FOR 6 WEEKS BEFORE ISSUING A FINAL REPORT   Report Status PENDING   Incomplete    RADIOLOGY STUDIES/RESULTS: Ct Angio Chest Pe W/cm &/or Wo Cm  01/20/2013   *RADIOLOGY REPORT*  Clinical Data:  Chest pain.  Abdominal pain.  CT ANGIOGRAPHY CHEST, ABDOMEN AND PELVIS  Technique:  Multidetector CT imaging through the chest, abdomen and pelvis was performed using the standard protocol during bolus administration of intravenous contrast.  Multiplanar reconstructed images including MIPs were obtained and reviewed to evaluate the vascular anatomy.  Contrast: OMNIPAQUE IOHEXOL 350 MG/ML SOLN  Comparison:  01/16/2013  CTA CHEST  Findings:  There is no evidence of aortic aneurysm, aortic dissection, or aortic transection.  Innominate artery, right subclavian artery, right common carotid artery, left common carotid artery, left subclavian artery, and bilateral vertebral arteries are patent.  No pericardial effusion.  No abnormal adenopathy by measurement criteria.  No obvious filling defect in the pulmonary arterial tree to suggest acute pulmonary thromboembolism.  There is mild wall thickening of the distal esophagus just above the GE junction.  No pneumothorax.  No pleural effusion.  Pleural-based 2.2 x 1.2 cm mass at the right apex associated with surrounding micronodules.  There are other irregular, triangular, and patchy opacities within the right upper lobe. 8 mm spiculated opacity in the left upper lobe towards the apex on image 37.  Dependent atelectasis at the lung bases.  No acute bony deformity.   Review of the MIP images confirms the above  findings.  IMPRESSION: No evidence of aortic dissection.  Bilateral apical lung masses.  Primary considerations include malignancy and TB.  Small nodules and irregular opacities within the right upper lobe support the possibility of mycobacterium infection.  Correlate clinically as for the need  for workup.  CTA ABDOMEN AND PELVIS  Findings:  No evidence of aortic dissection or aortic aneurysm.  Celiac, SMA, and IMA are patent.  Branch vessels are patent. Single renal arteries are patent.  Bilateral internal, external, and common iliac arteries are patent.  Multiple small foci of arterial enhancement within the liver are present.  Given history of hepatitis C, hepatic cellular carcinoma is not excluded.  Gallbladder, pancreas, spleen, adrenal glands, kidneys are stable in appearance.  Appendix is not visualized.  Bladder is within normal limits.  No free fluid.  No obvious abnormal adenopathy.  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  Bilateral L5 nerve root encroachment at this level.   Review of the MIP images confirms the above findings.  IMPRESSION: No evidence of aortic dissection or acute arterial abnormality.  Multiple foci of arterial enhancement throughout the liver.  Given history of hepatitis C, hepatic cellular carcinoma is not excluded. MRI is recommended to further characterize.  Otherwise stable exam.   Original Report Authenticated By: Jolaine Click, M.D.   US Abdomen Complete  01/16/2013   *RADIOLOGY REPORT*  Clinical Data:  Lower quadrant abdominal pain.  COMPLETE ABDOMINAL ULTRASOUND  Comparison:  CT scan 01/14/2013.  Findings:  Gallbladder:  Echogenic sludge noted layering in the gallbladder. No gallbladder wall thickening, pericholecystic fluid or definite gallstones.  Common bile duct:  Normal in caliber measuring a maximum of 4.18mm.  Liver:  There is diffuse increased echogenicity of the liver and decreased through transmission consistent with fatty infiltration. No focal lesions or biliary dilatation.  IVC:  Normal caliber.  Pancreas:  Not well visualized due to overlying bowel gas but was normal on the recent CT scan.  Spleen:  Normal size and echogenicity without focal lesions.  Right Kidney:  10.0 cm in length. Normal renal cortical thickness and echogenicity without  focal lesions or hydronephrosis.  Left Kidney:  10.8 cm in length. Normal renal cortical thickness and echogenicity without focal lesions or hydronephrosis.  Abdominal aorta:  Normal.  IMPRESSION:  1.  Layering echogenic sludge in the gallbladder but no definite gallstones or findings for acute cholecystitis. 2.  Normal caliber common bile duct. 3.  Diffuse fatty infiltration of the liver. 4.  Poor visualization of the pancreas.   Original Report Authenticated By: Rudie Meyer, M.D.   Ct Abdomen Pelvis W Contrast  01/16/2013   *RADIOLOGY REPORT*  Clinical Data: Epigastric pain.  CT ABDOMEN AND PELVIS WITH CONTRAST  Technique:  Multidetector CT imaging of the abdomen and pelvis was performed following the standard protocol during bolus administration of intravenous contrast.  Contrast: OMNIPAQUE IOHEXOL 300 MG/ML  SOLN  Comparison: 01/14/2013.  Findings: The lung bases are clear except for dependent atelectasis.  Mild diffuse fatty infiltration of the liver but no focal hepatic lesions or intrahepatic biliary dilatation.  Gallbladder is grossly normal.  No common bile duct dilatation.  The pancreas is normal. The spleen is normal.  The adrenal glands and kidneys are normal.  The stomach, duodenum, small bowel and colon are unremarkable.  No inflammatory changes or mass lesions.  The appendix is surgically absent.  No mesenteric or retroperitoneal mass or adenopathy.  The aorta is normal in caliber.  The major  branch vessels are patent.  The bladder is distended almost to the level of the umbilicus.  No pelvic mass, adenopathy or free pelvic fluid collections.  The prostate gland and seminal vesicles are unremarkable.  No inguinal mass or hernia.  The bony pelvis is intact.  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  IMPRESSION:  1.  Distended bladder almost to the level of the umbilicus. 2.  No other significant abdominal/pelvic findings demonstrated.   Original Report  Authenticated By: Rudie Meyer, M.D.   Ct Abdomen Pelvis W Contrast  01/14/2013   *RADIOLOGY REPORT*  Clinical Data: Chest pain.  Abdominal pain.  CT ABDOMEN AND PELVIS WITH CONTRAST  Technique:  Multidetector CT imaging of the abdomen and pelvis was performed following the standard protocol during bolus administration of intravenous contrast.  Contrast: OMNIPAQUE IOHEXOL 300 MG/ML  SOLN, 50mL OMNIPAQUE IOHEXOL 300 MG/ML  SOLN  Comparison: 09/19/2012  Findings: Diffuse hepatic steatosis.  Gallbladder, spleen, pancreas, adrenal glands, kidneys are within normal limits.  Bladder is distended.  Unremarkable prostate.  No free fluid.  No abnormal adenopathy.  Post appendectomy clips.  Grade 1 L5-S1 spondylolisthesis.  IMPRESSION: No acute intra-abdominal or intrapelvic pathology.  Stable.   Original Report Authenticated By: Jolaine Click, M.D.   Dg Chest Portable 1 View  01/14/2013   *RADIOLOGY REPORT*  Clinical Data: Short of breath and cough  PORTABLE CHEST - 1 VIEW  Comparison: 11/21/2010  Findings: Heart is normal in size.  Lungs are clear.  No pleural effusion.  No pneumothorax.  No acute bony deformity.  IMPRESSION: No active cardiopulmonary disease.   Original Report Authenticated By: Jolaine Click, M.D.   Dg Abd Acute W/chest  01/20/2013   *RADIOLOGY REPORT*  Clinical Data: Abdominal pain  ACUTE ABDOMEN SERIES (ABDOMEN 2 VIEW & CHEST 1 VIEW)  Comparison: CT abdomen pelvis dated 01/16/2013  Findings: Lungs are essentially clear.  No focal consolidation.  No pleural effusion or pneumothorax.  Nonobstructive bowel gas pattern.  No evidence of free air under the diaphragm on the upright view.  Visualized osseous structures are within normal limits.  IMPRESSION: No evidence of acute cardiopulmonary disease.  No evidence of small bowel obstruction or free air.   Original Report Authenticated By: Charline Bills, M.D.   Ct Angio Abd/pel W/ And/or W/o  01/20/2013   *RADIOLOGY REPORT*  Clinical Data:   Chest pain.  Abdominal pain.  CT ANGIOGRAPHY CHEST, ABDOMEN AND PELVIS  Technique:  Multidetector CT imaging through the chest, abdomen and pelvis was performed using the standard protocol during bolus administration of intravenous contrast.  Multiplanar reconstructed images including MIPs were obtained and reviewed to evaluate the vascular anatomy.  Contrast: OMNIPAQUE IOHEXOL 350 MG/ML SOLN  Comparison:  01/16/2013  CTA CHEST  Findings:  There is no evidence of aortic aneurysm, aortic dissection, or aortic transection.  Innominate artery, right subclavian artery, right common carotid artery, left common carotid artery, left subclavian artery, and bilateral vertebral arteries are patent.  No pericardial effusion.  No abnormal adenopathy by measurement criteria.  No obvious filling defect in the pulmonary arterial tree to suggest acute pulmonary thromboembolism.  There is mild wall thickening of the distal esophagus just above the GE junction.  No pneumothorax.  No pleural effusion.  Pleural-based 2.2 x 1.2 cm mass at the right apex associated with surrounding micronodules.  There are other irregular, triangular, and patchy opacities within the right upper lobe. 8 mm spiculated opacity in  the left upper lobe towards the apex on image 37.  Dependent atelectasis at the lung bases.  No acute bony deformity.   Review of the MIP images confirms the above findings.  IMPRESSION: No evidence of aortic dissection.  Bilateral apical lung masses.  Primary considerations include malignancy and TB.  Small nodules and irregular opacities within the right upper lobe support the possibility of mycobacterium infection.  Correlate clinically as for the need for workup.  CTA ABDOMEN AND PELVIS  Findings:  No evidence of aortic dissection or aortic aneurysm.  Celiac, SMA, and IMA are patent.  Branch vessels are patent. Single renal arteries are patent.  Bilateral internal, external, and common iliac arteries are patent.  Multiple  small foci of arterial enhancement within the liver are present.  Given history of hepatitis C, hepatic cellular carcinoma is not excluded.  Gallbladder, pancreas, spleen, adrenal glands, kidneys are stable in appearance.  Appendix is not visualized.  Bladder is within normal limits.  No free fluid.  No obvious abnormal adenopathy.  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  Bilateral L5 nerve root encroachment at this level.   Review of the MIP images confirms the above findings.  IMPRESSION: No evidence of aortic dissection or acute arterial abnormality.  Multiple foci of arterial enhancement throughout the liver.  Given history of hepatitis C, hepatic cellular carcinoma is not excluded. MRI is recommended to further characterize.  Otherwise stable exam.   Original Report Authenticated By: Jolaine Click, M.D.    Jeoffrey Massed, MD  [facility] Pager:[phone]  If 7PM-7AM, please contact night-coverage [email or web address] Password [facility] 01/24/2013, 11:36 AM   LOS: 4 days

## 2013-01-24 NOTE — Progress Notes (Signed)
Used interpretor [phone] to see if pt. Was in pain and completed morning assessment, also to see if what pt. Wanted to eat for breakfast since he didn't like what he got.  Also order pt. Lunch for today & reviewed medications.  Will continue to monitor.  Forbes Cellar, RN

## 2013-01-24 NOTE — Progress Notes (Addendum)
INFECTIOUS DISEASE PROGRESS NOTE  ID: Adam Horton is a 47 y.o. male with  Principal Problem:   Abdominal pain Active Problems:   DM (diabetes mellitus)   HTN (hypertension)   Nonspecific (abnormal) findings on radiological and other examination of biliary tract   Other abnormal blood chemistry  Subjective: Denies cough, feels better  Abtx:  Anti-infectives   None      Medications:  Scheduled: . hydrocortisone  25 mg Rectal BID  . insulin aspart  0-9 Units Subcutaneous TID WC  . insulin detemir  15 Units Subcutaneous QHS  . lisinopril  2.5 mg Oral Daily  . pantoprazole  40 mg Oral BID AC  . sodium chloride  3 mL Intravenous Q12H    Objective: Vital signs in last 24 hours: Temp:  [97.8 F (36.6 C)-98.3 F (36.8 C)] 97.8 F (36.6 C) (05/27 0519) Pulse Rate:  [59-64] 59 (05/27 0519) Resp:  [18-20] 18 (05/27 0519) BP: (121-157)/(77-92) 128/80 mmHg (05/27 1022) SpO2:  [99 %-100 %] 100 % (05/27 0519)   General appearance: alert, cooperative and no distress Resp: clear to auscultation bilaterally Cardio: regular rate and rhythm GI: normal findings: bowel sounds normal and soft, non-tender  Lab Results  Recent Labs  01/23/13 0610  WBC 4.0  HGB 13.0  HCT 38.9*  NA 142  K 3.5  CL 108  CO2 21  BUN 12  CREATININE 0.97   Liver Panel  Recent Labs  01/23/13 0610  PROT 6.9  ALBUMIN 3.5  AST 49*  ALT 74*  ALKPHOS 67  BILITOT 0.5  BILIDIR 0.1  IBILI 0.4   Sedimentation Rate No results found for this basename: ESRSEDRATE,  in the last 72 hours C-Reactive Protein No results found for this basename: CRP,  in the last 72 hours  Microbiology: Recent Results (from the past 240 hour(s))  CULTURE, BLOOD (ROUTINE X 2)     Status: None   Collection Time    01/14/13  1:25 PM      Result Value Range Status   Specimen Description BLOOD LEFT ANTECUBITAL   Final   Special Requests BOTTLES DRAWN AEROBIC AND ANAEROBIC 3CC   Final   Culture  Setup Time  01/14/2013 19:00   Final   Culture NO GROWTH 5 DAYS   Final   Report Status 01/20/2013 FINAL   Final  CULTURE, BLOOD (ROUTINE X 2)     Status: None   Collection Time    01/14/13  1:31 PM      Result Value Range Status   Specimen Description BLOOD LEFT HAND   Final   Special Requests BOTTLES DRAWN AEROBIC ONLY 3CC   Final   Culture  Setup Time 01/14/2013 19:00   Final   Culture NO GROWTH 5 DAYS   Final   Report Status 01/20/2013 FINAL   Final  MRSA PCR SCREENING     Status: None   Collection Time    01/20/13  8:16 PM      Result Value Range Status   MRSA by PCR NEGATIVE  NEGATIVE Final   Comment:            The GeneXpert MRSA Assay (FDA     approved for NASAL specimens     only), is one component of a     comprehensive MRSA colonization     surveillance program. It is not     intended to diagnose MRSA     infection nor to guide or  monitor treatment for     MRSA infections.  AFB CULTURE WITH SMEAR     Status: None   Collection Time    01/21/13  2:47 PM      Result Value Range Status   Specimen Description GASTRIC ASPIRATE   Final   Special Requests NONE   Final   ACID FAST SMEAR NO ACID FAST BACILLI SEEN   Final   Culture     Final   Value: CULTURE WILL BE EXAMINED FOR 6 WEEKS BEFORE ISSUING A FINAL REPORT   Report Status PENDING   Incomplete  AFB CULTURE, BLOOD     Status: None   Collection Time    01/23/13  3:00 PM      Result Value Range Status   Specimen Description BLOOD LEFT ARM   Final   Special Requests 4CC   Final   Culture     Final   Value: CULTURE WILL BE EXAMINED FOR 6 WEEKS BEFORE ISSUING A FINAL REPORT   Report Status PENDING   Incomplete    Studies/Results: No results found.   Assessment/Plan: cavitary lung lesions  Total days of antibiotics: 0 Would have pulmonary eval for BAL? His gastric aspirate AFB is (-) His sputum afb is (-).          [name] Infectious Diseases [phone] [email or web address] 01/24/2013, 11:24 AM    LOS: 4 days

## 2013-01-24 NOTE — Progress Notes (Signed)
Patient is receiving Protonix by the IV route.  Pt meets the P & T approved criteria for changing to oral administration.  - No GI bleeding  - Tolerating an oral or per tube diet  - Taking other oral or per tube medications.  Will change patient to Protonix 40mg  PO twice daily per P&T policy.  Thank you. [employer], Pharm.D., BCPS Clinical Pharmacist Pager [phone]

## 2013-01-25 ENCOUNTER — Encounter (HOSPITAL_COMMUNITY): Payer: Self-pay | Admitting: Internal Medicine

## 2013-01-25 DIAGNOSIS — B1920 Unspecified viral hepatitis C without hepatic coma: Secondary | ICD-10-CM

## 2013-01-25 LAB — GLUCOSE, CAPILLARY
Glucose-Capillary: 133 mg/dL — ABNORMAL HIGH (ref 70–99)
Glucose-Capillary: 113 mg/dL — ABNORMAL HIGH (ref 70–99)
Glucose-Capillary: 170 mg/dL — ABNORMAL HIGH (ref 70–99)

## 2013-01-25 LAB — CBC
Hemoglobin: 12.5 g/dL — ABNORMAL LOW (ref 13.0–17.0)
MCH: 23.3 pg — ABNORMAL LOW (ref 26.0–34.0)
MCHC: 32.7 g/dL (ref 30.0–36.0)
Platelets: 214 10*3/uL (ref 150–400)

## 2013-01-25 MED ORDER — POTASSIUM CHLORIDE CRYS ER 20 MEQ PO TBCR
40.0000 meq | EXTENDED_RELEASE_TABLET | Freq: Once | ORAL | Status: AC
  Administered 2013-01-25: 40 meq via ORAL
  Filled 2013-01-25: qty 2

## 2013-01-25 NOTE — Progress Notes (Signed)
Pt still refuses IV start.

## 2013-01-25 NOTE — Progress Notes (Signed)
INFECTIOUS DISEASE PROGRESS NOTE  ID: Adam Horton is a 47 y.o. male with  Principal Problem:   Abdominal pain Active Problems:   DM (diabetes mellitus)   HTN (hypertension)   Nonspecific (abnormal) findings on radiological and other examination of biliary tract   Other abnormal blood chemistry  Subjective: C/o abd pain.   Abtx:  Anti-infectives   None      Medications:  Scheduled: . enoxaparin (LOVENOX) injection  40 mg Subcutaneous Q24H  . hydrocortisone  25 mg Rectal BID  . insulin aspart  0-9 Units Subcutaneous TID WC  . insulin detemir  15 Units Subcutaneous QHS  . lisinopril  2.5 mg Oral Daily  . pantoprazole  40 mg Oral BID AC  . sodium chloride  3 mL Intravenous Q12H    Objective: Vital signs in last 24 hours: Temp:  [97.6 F (36.4 C)-98.4 F (36.9 C)] 97.8 F (36.6 C) (05/28 1437) Pulse Rate:  [56-71] 71 (05/28 1437) Resp:  [18-20] 18 (05/28 1437) BP: (112-158)/(77-98) 112/77 mmHg (05/28 1437) SpO2:  [99 %-100 %] 100 % (05/28 1437)   General appearance: alert, cooperative and no distress Resp: clear to auscultation bilaterally Cardio: regular rate and rhythm GI: normal findings: bowel sounds normal and soft, non-tender  Lab Results  Recent Labs  01/23/13 0610 01/25/13 0518  WBC 4.0 3.2*  HGB 13.0 12.5*  HCT 38.9* 38.2*  NA 142  --   K 3.5  --   CL 108  --   CO2 21  --   BUN 12  --   CREATININE 0.97  --    Liver Panel  Recent Labs  01/23/13 0610  PROT 6.9  ALBUMIN 3.5  AST 49*  ALT 74*  ALKPHOS 67  BILITOT 0.5  BILIDIR 0.1  IBILI 0.4   Sedimentation Rate No results found for this basename: ESRSEDRATE,  in the last 72 hours C-Reactive Protein No results found for this basename: CRP,  in the last 72 hours  Microbiology: Recent Results (from the past 240 hour(s))  MRSA PCR SCREENING     Status: None   Collection Time    01/20/13  8:16 PM      Result Value Range Status   MRSA by PCR NEGATIVE  NEGATIVE Final   Comment:             The GeneXpert MRSA Assay (FDA     approved for NASAL specimens     only), is one component of a     comprehensive MRSA colonization     surveillance program. It is not     intended to diagnose MRSA     infection nor to guide or     monitor treatment for     MRSA infections.  AFB CULTURE WITH SMEAR     Status: None   Collection Time    01/21/13  2:47 PM      Result Value Range Status   Specimen Description GASTRIC ASPIRATE   Final   Special Requests NONE   Final   ACID FAST SMEAR NO ACID FAST BACILLI SEEN   Final   Culture     Final   Value: CULTURE WILL BE EXAMINED FOR 6 WEEKS BEFORE ISSUING A FINAL REPORT   Report Status PENDING   Incomplete  AFB CULTURE WITH SMEAR     Status: None   Collection Time    01/23/13  5:00 AM      Result Value Range Status   Specimen Description  SPUTUM   Final   Special Requests NONE   Final   ACID FAST SMEAR NO ACID FAST BACILLI SEEN   Final   Culture     Final   Value: CULTURE WILL BE EXAMINED FOR 6 WEEKS BEFORE ISSUING A FINAL REPORT   Report Status PENDING   Incomplete  AFB CULTURE, BLOOD     Status: None   Collection Time    01/23/13  3:00 PM      Result Value Range Status   Specimen Description BLOOD LEFT ARM   Final   Special Requests 4CC   Final   Culture     Final   Value: CULTURE WILL BE EXAMINED FOR 6 WEEKS BEFORE ISSUING A FINAL REPORT   Report Status PENDING   Incomplete  AFB CULTURE WITH SMEAR     Status: None   Collection Time    01/24/13  9:06 AM      Result Value Range Status   Specimen Description SPUTUM   Final   Special Requests NONE   Final   ACID FAST SMEAR NO ACID FAST BACILLI SEEN   Final   Culture     Final   Value: CULTURE WILL BE EXAMINED FOR 6 WEEKS BEFORE ISSUING A FINAL REPORT   Report Status PENDING   Incomplete    Studies/Results: No results found.   Assessment/Plan: Cavitary Lung Lesions + Quantiferon gold Hepatitis C  Suspect he has TB. Would ask pulm to consider bronch for him as he  could still have other dx (quantiferon does not discriminate between active TB and previous exposure).   Total days of antibiotics: 0         Adam Horton Infectious Diseases [phone] [email or web address] 01/25/2013, 4:38 PM   LOS: 5 days

## 2013-01-25 NOTE — Progress Notes (Signed)
Pt. Had some questions about medications, used interpreter [phone] to answer questions.  Pt. Wanted to know what medication he could take for heartburn, states he's gets heartburn after he eat.  Explained to pt. That the GI cocktail that I just gave him will help the heartburn.  He can have it three times a day, every eight hours, but he has to ask for it.  Pt. Had his home medication in his coat pocket and showed the RN, trying to see what medication was for vomit.  Explained to pt. That our policy states he can't keep medication in the room.  Pt. Stated he wasn't taking the medication.  Informed pt. That we would give the medication back to him at discharge.  Will continue to monitor and answer pt. Questions.  Forbes Cellar, RN

## 2013-01-25 NOTE — Progress Notes (Signed)
PATIENT DETAILS Name: Cordarrius Coad Age: 47 y.o. Sex: male Date of Birth: [birth year] Admit Date: 01/20/2013 Admitting Physician Dewayne Shorter Levora Dredge, MD PCP:No PCP Per Patient  Brief summary Patient is a 47 year old [place] male with a past medical history of hypertension, diabetes,? Hepatitis C who presented to the emergency room on 5/23 with diffuse abdominal and chest pain for a week. This was his third emergency room visit. He already had 2 CT scans of his abdomen prior to this visit. Given ongoing chest and abdominal pain, patient was admitted to the step down unit, he had elevated lactic acid levels on admission therefore a CT angiogram of the chest and abdomen was pursued which showed a normal vascular anatomy, however the CTA of the chest showed bilateral pulmonary apical lesions consistent with perhaps tuberculosis. GI was consulted, EGD was positive for gastritis however it was felt that this was not responsible for his severe pain. His pain has improved with supportive care including PPI, he is currently on airborne isolation and we are awaiting sputum AFB.Infectious disease has been consulted.  Subjective: Denies abdominal pain, no fever or chills per  Assessment/Plan: Principal Problem:  Abdominal pain -Etiology remains unclear-but clearly better and continues to improve daily. Did have mild lipase elevation on admission, however suspect pancreas not to be the etiology  -EGD 5/24-showed gastritis-do not think this still explains his abdominal pain -CT Angio Chest/Abdomen-no acute abnormalities to explain this amount of pain. -check ESR only 8, so doubt a chronic inflammatory condition -??anxiety was playing a role -Minimize Narcotics  Active Problems: B/L upper lobe lesion in lung -?TB, ID service is following. -c/w Airborne isolation, sputum AFB currently pending - Gastric aspirate neg for AFB -HIV negative -he has been in the [place] for the past 12 years, claims no hx of prior  TB while in [place], claims that a PPD done during his immigration here was negative - Positive Quantiferon-tb gold  Intermittent minimal rectal bleeding - Suspected hemorrhoidal etiology- on Anusol - Check CBC in a.m.  ? Hep C -await Viral load -LFT's stable  DM (diabetes mellitus) -CBG's stable -c/w SSI -resumed Levemir at reduced dose-will increase to 15 units  HTN (hypertension) -resume Lisinopril  Disposition: Remain inpatient  DVT Prophylaxis: Start prophylactic Lovenox  Code Status: Full code   Family Communication None at bedside  Procedures:  None  CONSULTS:  GI   MEDICATIONS: Scheduled Meds: . enoxaparin (LOVENOX) injection  40 mg Subcutaneous Q24H  . hydrocortisone  25 mg Rectal BID  . insulin aspart  0-9 Units Subcutaneous TID WC  . insulin detemir  15 Units Subcutaneous QHS  . lisinopril  2.5 mg Oral Daily  . pantoprazole  40 mg Oral BID AC  . pneumococcal 23 valent vaccine  0.5 mL Intramuscular Tomorrow-1000  . sodium chloride  3 mL Intravenous Q12H   Continuous Infusions:   PRN Meds:.acetaminophen, acetaminophen, albuterol, diphenhydrAMINE, gi cocktail, ondansetron (ZOFRAN) IV, ondansetron, oxyCODONE, sodium chloride HYPERTONIC  Antibiotics: Anti-infectives   None       PHYSICAL EXAM: Vital signs in last 24 hours: Filed Vitals:   01/24/13 1022 01/24/13 1500 01/24/13 2100 01/25/13 0547  BP: 128/80 144/93 158/98 128/84  Pulse:  67 56 60  Temp:  97.7 F (36.5 C) 98.4 F (36.9 C) 97.6 F (36.4 C)  TempSrc:  Oral Oral Oral  Resp:  18 20 18   Height:      Weight:      SpO2:  100% 100% 99%    Weight change:  Filed Weights   01/20/13 2000  Weight: 60.6 kg (133 lb 9.6 oz)   Body mass index is 21.57 kg/(m^2).   Gen Exam: Awake and alert with clear speech.   Neck: Supple, No JVD.   Chest: B/L Clear.   CVS: S1 S2 Regular, no murmurs.  Abdomen: soft, BS +, mildly tender in epigastric, non distended. No rebound Extremities: no  edema, lower extremities warm to touch Neurologic: Non Focal.   Skin: No Rash.   Wounds: N/A.    Intake/Output from previous day:  Intake/Output Summary (Last 24 hours) at 01/25/13 1101 Last data filed at 01/24/13 2308  Gross per 24 hour  Intake    220 ml  Output      0 ml  Net    220 ml     LAB RESULTS: CBC  Recent Labs Lab 01/20/13 1409 01/21/13 0520 01/23/13 0610 01/25/13 0518  WBC 4.6 4.7 4.0 3.2*  HGB 14.3 12.8* 13.0 12.5*  HCT 41.6 38.5* 38.9* 38.2*  PLT 248 230 204 214  MCV 71.2* 72.0* 71.2* 71.3*  MCH 24.5* 23.9* 23.8* 23.3*  MCHC 34.4 33.2 33.4 32.7  RDW 13.6 13.6 13.7 13.7  LYMPHSABS 1.0  --   --   --   MONOABS 0.1  --   --   --   EOSABS 0.0  --   --   --   BASOSABS 0.0  --   --   --     Chemistries   Recent Labs Lab 01/20/13 1409 01/21/13 0520 01/23/13 0610  NA 131* 140 142  K 5.2* 3.6 3.5  CL 98 104 108  CO2 21 24 21   GLUCOSE 254* 117* 101*  BUN 10 8 12   CREATININE 1.04 1.06 0.97  CALCIUM 9.7 8.7 9.1    CBG:  Recent Labs Lab 01/24/13 0847 01/24/13 1258 01/24/13 1710 01/24/13 2131 01/25/13 0820  GLUCAP 112* 223* 91 133* 133*    GFR Estimated Creatinine Clearance: 81.6 ml/min (by C-G formula based on Cr of 0.97).  Coagulation profile No results found for this basename: INR, PROTIME,  in the last 168 hours  Cardiac Enzymes  Recent Labs Lab 01/20/13 1825 01/20/13 2343 01/21/13 0520  TROPONINI <0.30 <0.30 <0.30    No components found with this basename: POCBNP,  No results found for this basename: DDIMER,  in the last 72 hours No results found for this basename: HGBA1C,  in the last 72 hours No results found for this basename: CHOL, HDL, LDLCALC, TRIG, CHOLHDL, LDLDIRECT,  in the last 72 hours No results found for this basename: TSH, T4TOTAL, FREET3, T3FREE, THYROIDAB,  in the last 72 hours No results found for this basename: VITAMINB12, FOLATE, FERRITIN, TIBC, IRON, RETICCTPCT,  in the last 72 hours No results found for  this basename: LIPASE, AMYLASE,  in the last 72 hours  Urine Studies No results found for this basename: UACOL, UAPR, USPG, UPH, UTP, UGL, UKET, UBIL, UHGB, UNIT, UROB, ULEU, UEPI, UWBC, URBC, UBAC, CAST, CRYS, UCOM, BILUA,  in the last 72 hours  MICROBIOLOGY: Recent Results (from the past 240 hour(s))  MRSA PCR SCREENING     Status: None   Collection Time    01/20/13  8:16 PM      Result Value Range Status   MRSA by PCR NEGATIVE  NEGATIVE Final   Comment:            The GeneXpert MRSA Assay (FDA     approved for NASAL specimens  only), is one component of a     comprehensive MRSA colonization     surveillance program. It is not     intended to diagnose MRSA     infection nor to guide or     monitor treatment for     MRSA infections.  AFB CULTURE WITH SMEAR     Status: None   Collection Time    01/21/13  2:47 PM      Result Value Range Status   Specimen Description GASTRIC ASPIRATE   Final   Special Requests NONE   Final   ACID FAST SMEAR NO ACID FAST BACILLI SEEN   Final   Culture     Final   Value: CULTURE WILL BE EXAMINED FOR 6 WEEKS BEFORE ISSUING A FINAL REPORT   Report Status PENDING   Incomplete  AFB CULTURE WITH SMEAR     Status: None   Collection Time    01/23/13  5:00 AM      Result Value Range Status   Specimen Description SPUTUM   Final   Special Requests NONE   Final   ACID FAST SMEAR NO ACID FAST BACILLI SEEN   Final   Culture     Final   Value: CULTURE WILL BE EXAMINED FOR 6 WEEKS BEFORE ISSUING A FINAL REPORT   Report Status PENDING   Incomplete  AFB CULTURE, BLOOD     Status: None   Collection Time    01/23/13  3:00 PM      Result Value Range Status   Specimen Description BLOOD LEFT ARM   Final   Special Requests 4CC   Final   Culture     Final   Value: CULTURE WILL BE EXAMINED FOR 6 WEEKS BEFORE ISSUING A FINAL REPORT   Report Status PENDING   Incomplete    RADIOLOGY STUDIES/RESULTS: Ct Angio Chest Pe W/cm &/or Wo Cm  01/20/2013   *RADIOLOGY  REPORT*  Clinical Data:  Chest pain.  Abdominal pain.  CT ANGIOGRAPHY CHEST, ABDOMEN AND PELVIS  Technique:  Multidetector CT imaging through the chest, abdomen and pelvis was performed using the standard protocol during bolus administration of intravenous contrast.  Multiplanar reconstructed images including MIPs were obtained and reviewed to evaluate the vascular anatomy.  Contrast: OMNIPAQUE IOHEXOL 350 MG/ML SOLN  Comparison:  01/16/2013  CTA CHEST  Findings:  There is no evidence of aortic aneurysm, aortic dissection, or aortic transection.  Innominate artery, right subclavian artery, right common carotid artery, left common carotid artery, left subclavian artery, and bilateral vertebral arteries are patent.  No pericardial effusion.  No abnormal adenopathy by measurement criteria.  No obvious filling defect in the pulmonary arterial tree to suggest acute pulmonary thromboembolism.  There is mild wall thickening of the distal esophagus just above the GE junction.  No pneumothorax.  No pleural effusion.  Pleural-based 2.2 x 1.2 cm mass at the right apex associated with surrounding micronodules.  There are other irregular, triangular, and patchy opacities within the right upper lobe. 8 mm spiculated opacity in the left upper lobe towards the apex on image 37.  Dependent atelectasis at the lung bases.  No acute bony deformity.   Review of the MIP images confirms the above findings.  IMPRESSION: No evidence of aortic dissection.  Bilateral apical lung masses.  Primary considerations include malignancy and TB.  Small nodules and irregular opacities within the right upper lobe support the possibility of mycobacterium infection.  Correlate clinically as for the need  for workup.  CTA ABDOMEN AND PELVIS  Findings:  No evidence of aortic dissection or aortic aneurysm.  Celiac, SMA, and IMA are patent.  Branch vessels are patent. Single renal arteries are patent.  Bilateral internal, external, and common iliac  arteries are patent.  Multiple small foci of arterial enhancement within the liver are present.  Given history of hepatitis C, hepatic cellular carcinoma is not excluded.  Gallbladder, pancreas, spleen, adrenal glands, kidneys are stable in appearance.  Appendix is not visualized.  Bladder is within normal limits.  No free fluid.  No obvious abnormal adenopathy.  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  Bilateral L5 nerve root encroachment at this level.   Review of the MIP images confirms the above findings.  IMPRESSION: No evidence of aortic dissection or acute arterial abnormality.  Multiple foci of arterial enhancement throughout the liver.  Given history of hepatitis C, hepatic cellular carcinoma is not excluded. MRI is recommended to further characterize.  Otherwise stable exam.   Original Report Authenticated By: Jolaine Click, M.D.   US Abdomen Complete  01/16/2013   *RADIOLOGY REPORT*  Clinical Data:  Lower quadrant abdominal pain.  COMPLETE ABDOMINAL ULTRASOUND  Comparison:  CT scan 01/14/2013.  Findings:  Gallbladder:  Echogenic sludge noted layering in the gallbladder. No gallbladder wall thickening, pericholecystic fluid or definite gallstones.  Common bile duct:  Normal in caliber measuring a maximum of 4.70mm.  Liver:  There is diffuse increased echogenicity of the liver and decreased through transmission consistent with fatty infiltration. No focal lesions or biliary dilatation.  IVC:  Normal caliber.  Pancreas:  Not well visualized due to overlying bowel gas but was normal on the recent CT scan.  Spleen:  Normal size and echogenicity without focal lesions.  Right Kidney:  10.0 cm in length. Normal renal cortical thickness and echogenicity without focal lesions or hydronephrosis.  Left Kidney:  10.8 cm in length. Normal renal cortical thickness and echogenicity without focal lesions or hydronephrosis.  Abdominal aorta:  Normal.  IMPRESSION:  1.  Layering echogenic sludge in the  gallbladder but no definite gallstones or findings for acute cholecystitis. 2.  Normal caliber common bile duct. 3.  Diffuse fatty infiltration of the liver. 4.  Poor visualization of the pancreas.   Original Report Authenticated By: Rudie Meyer, M.D.   Ct Abdomen Pelvis W Contrast  01/16/2013   *RADIOLOGY REPORT*  Clinical Data: Epigastric pain.  CT ABDOMEN AND PELVIS WITH CONTRAST  Technique:  Multidetector CT imaging of the abdomen and pelvis was performed following the standard protocol during bolus administration of intravenous contrast.  Contrast: OMNIPAQUE IOHEXOL 300 MG/ML  SOLN  Comparison: 01/14/2013.  Findings: The lung bases are clear except for dependent atelectasis.  Mild diffuse fatty infiltration of the liver but no focal hepatic lesions or intrahepatic biliary dilatation.  Gallbladder is grossly normal.  No common bile duct dilatation.  The pancreas is normal. The spleen is normal.  The adrenal glands and kidneys are normal.  The stomach, duodenum, small bowel and colon are unremarkable.  No inflammatory changes or mass lesions.  The appendix is surgically absent.  No mesenteric or retroperitoneal mass or adenopathy.  The aorta is normal in caliber.  The major branch vessels are patent.  The bladder is distended almost to the level of the umbilicus.  No pelvic mass, adenopathy or free pelvic fluid collections.  The prostate gland and seminal vesicles are unremarkable.  No inguinal mass or hernia.  The bony  pelvis is intact.  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  IMPRESSION:  1.  Distended bladder almost to the level of the umbilicus. 2.  No other significant abdominal/pelvic findings demonstrated.   Original Report Authenticated By: Rudie Meyer, M.D.   Ct Abdomen Pelvis W Contrast  01/14/2013   *RADIOLOGY REPORT*  Clinical Data: Chest pain.  Abdominal pain.  CT ABDOMEN AND PELVIS WITH CONTRAST  Technique:  Multidetector CT imaging of the  abdomen and pelvis was performed following the standard protocol during bolus administration of intravenous contrast.  Contrast: OMNIPAQUE IOHEXOL 300 MG/ML  SOLN, 50mL OMNIPAQUE IOHEXOL 300 MG/ML  SOLN  Comparison: 09/19/2012  Findings: Diffuse hepatic steatosis.  Gallbladder, spleen, pancreas, adrenal glands, kidneys are within normal limits.  Bladder is distended.  Unremarkable prostate.  No free fluid.  No abnormal adenopathy.  Post appendectomy clips.  Grade 1 L5-S1 spondylolisthesis.  IMPRESSION: No acute intra-abdominal or intrapelvic pathology.  Stable.   Original Report Authenticated By: Jolaine Click, M.D.   Dg Chest Portable 1 View  01/14/2013   *RADIOLOGY REPORT*  Clinical Data: Short of breath and cough  PORTABLE CHEST - 1 VIEW  Comparison: 11/21/2010  Findings: Heart is normal in size.  Lungs are clear.  No pleural effusion.  No pneumothorax.  No acute bony deformity.  IMPRESSION: No active cardiopulmonary disease.   Original Report Authenticated By: Jolaine Click, M.D.   Dg Abd Acute W/chest  01/20/2013   *RADIOLOGY REPORT*  Clinical Data: Abdominal pain  ACUTE ABDOMEN SERIES (ABDOMEN 2 VIEW & CHEST 1 VIEW)  Comparison: CT abdomen pelvis dated 01/16/2013  Findings: Lungs are essentially clear.  No focal consolidation.  No pleural effusion or pneumothorax.  Nonobstructive bowel gas pattern.  No evidence of free air under the diaphragm on the upright view.  Visualized osseous structures are within normal limits.  IMPRESSION: No evidence of acute cardiopulmonary disease.  No evidence of small bowel obstruction or free air.   Original Report Authenticated By: Charline Bills, M.D.   Ct Angio Abd/pel W/ And/or W/o  01/20/2013   *RADIOLOGY REPORT*  Clinical Data:  Chest pain.  Abdominal pain.  CT ANGIOGRAPHY CHEST, ABDOMEN AND PELVIS  Technique:  Multidetector CT imaging through the chest, abdomen and pelvis was performed using the standard protocol during bolus administration of intravenous  contrast.  Multiplanar reconstructed images including MIPs were obtained and reviewed to evaluate the vascular anatomy.  Contrast: OMNIPAQUE IOHEXOL 350 MG/ML SOLN  Comparison:  01/16/2013  CTA CHEST  Findings:  There is no evidence of aortic aneurysm, aortic dissection, or aortic transection.  Innominate artery, right subclavian artery, right common carotid artery, left common carotid artery, left subclavian artery, and bilateral vertebral arteries are patent.  No pericardial effusion.  No abnormal adenopathy by measurement criteria.  No obvious filling defect in the pulmonary arterial tree to suggest acute pulmonary thromboembolism.  There is mild wall thickening of the distal esophagus just above the GE junction.  No pneumothorax.  No pleural effusion.  Pleural-based 2.2 x 1.2 cm mass at the right apex associated with surrounding micronodules.  There are other irregular, triangular, and patchy opacities within the right upper lobe. 8 mm spiculated opacity in the left upper lobe towards the apex on image 37.  Dependent atelectasis at the lung bases.  No acute bony deformity.   Review of the MIP images confirms the above findings.  IMPRESSION: No evidence of aortic dissection.  Bilateral apical lung  masses.  Primary considerations include malignancy and TB.  Small nodules and irregular opacities within the right upper lobe support the possibility of mycobacterium infection.  Correlate clinically as for the need for workup.  CTA ABDOMEN AND PELVIS  Findings:  No evidence of aortic dissection or aortic aneurysm.  Celiac, SMA, and IMA are patent.  Branch vessels are patent. Single renal arteries are patent.  Bilateral internal, external, and common iliac arteries are patent.  Multiple small foci of arterial enhancement within the liver are present.  Given history of hepatitis C, hepatic cellular carcinoma is not excluded.  Gallbladder, pancreas, spleen, adrenal glands, kidneys are stable in appearance.  Appendix  is not visualized.  Bladder is within normal limits.  No free fluid.  No obvious abnormal adenopathy.  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  Bilateral L5 nerve root encroachment at this level.   Review of the MIP images confirms the above findings.  IMPRESSION: No evidence of aortic dissection or acute arterial abnormality.  Multiple foci of arterial enhancement throughout the liver.  Given history of hepatitis C, hepatic cellular carcinoma is not excluded. MRI is recommended to further characterize.  Otherwise stable exam.   Original Report Authenticated By: Jolaine Click, M.D.    Clint Lipps, MD  [facility] Pager:[phone]  If 7PM-7AM, please contact night-coverage [email or web address] Password [email or web address] 01/25/2013, 11:01 AM   LOS: 5 days

## 2013-01-25 NOTE — Progress Notes (Addendum)
Used interpretor [id number] to explained to pt. That I had to give him the PNA vaccine.  Pt. Continues to states he needs ABX for his stomach.  Says that his stomach hurt on the left lower quadrant when he doesn't pass gas.  Informed the pt. That ABX. Wouldn't help that he needs to walk around in the room. Pt. Stated that he has been walking around in the room.  Informed the pt. I would let the MD know his concerns and that I would hive him the GI cocktail ordered PRN.  Informed Dr. Arthor Captain and he also agree with giving the GI cocktail.  Will continue to monitor and administer med's as ordered.  Forbes Cellar, RN

## 2013-01-25 NOTE — Progress Notes (Signed)
Pt refused to get IV- asked that he waits until an "American" comes to do it. RN will continue to monitor pt.

## 2013-01-26 LAB — CBC
Hemoglobin: 11.8 g/dL — ABNORMAL LOW (ref 13.0–17.0)
MCHC: 32.8 g/dL (ref 30.0–36.0)
RDW: 14 % (ref 11.5–15.5)

## 2013-01-26 LAB — BASIC METABOLIC PANEL
GFR calc Af Amer: >90 mL/min (ref >90)
GFR calc non Af Amer: 79 mL/min — ABNORMAL LOW (ref >90)
Potassium: 3.7 mEq/L (ref 3.5–5.1)
Sodium: 139 mEq/L (ref 135–145)

## 2013-01-26 LAB — GLUCOSE, CAPILLARY: Glucose-Capillary: 154 mg/dL — ABNORMAL HIGH (ref 70–99)

## 2013-01-26 NOTE — Progress Notes (Addendum)
INFECTIOUS DISEASE PROGRESS NOTE  ID: Greer Koeppen is a 47 y.o. male with   Principal Problem:   Abdominal pain Active Problems:   DM (diabetes mellitus)   HTN (hypertension)   Nonspecific (abnormal) findings on radiological and other examination of biliary tract   Other abnormal blood chemistry  Subjective: Without complaints  Abtx:  Anti-infectives   None      Medications:  Scheduled: . enoxaparin (LOVENOX) injection  40 mg Subcutaneous Q24H  . hydrocortisone  25 mg Rectal BID  . insulin aspart  0-9 Units Subcutaneous TID WC  . insulin detemir  15 Units Subcutaneous QHS  . lisinopril  2.5 mg Oral Daily  . pantoprazole  40 mg Oral BID AC  . sodium chloride  3 mL Intravenous Q12H    Objective: Vital signs in last 24 hours: Temp:  [97.8 F (36.6 C)-98.5 F (36.9 C)] 98.3 F (36.8 C) (05/29 0521) Pulse Rate:  [65-73] 73 (05/29 0521) Resp:  [18-20] 18 (05/29 0521) BP: (94-117)/(62-77) 94/62 mmHg (05/29 0521) SpO2:  [97 %-100 %] 97 % (05/29 0521)   General appearance: alert, cooperative and no distress Resp: clear to auscultation bilaterally Cardio: regular rate and rhythm GI: normal findings: bowel sounds normal and soft, non-tender  Lab Results  Recent Labs  01/25/13 0518 01/26/13 0525  WBC 3.2* 4.2  HGB 12.5* 11.8*  HCT 38.2* 36.0*  NA  --  139  K  --  3.7  CL  --  104  CO2  --  22  BUN  --  10  CREATININE  --  1.10   Liver Panel No results found for this basename: PROT, ALBUMIN, AST, ALT, ALKPHOS, BILITOT, BILIDIR, IBILI,  in the last 72 hours Sedimentation Rate No results found for this basename: ESRSEDRATE,  in the last 72 hours C-Reactive Protein No results found for this basename: CRP,  in the last 72 hours  Microbiology: Recent Results (from the past 240 hour(s))  MRSA PCR SCREENING     Status: None   Collection Time    01/20/13  8:16 PM      Result Value Range Status   MRSA by PCR NEGATIVE  NEGATIVE Final   Comment:          The GeneXpert MRSA Assay (FDA     approved for NASAL specimens     only), is one component of a     comprehensive MRSA colonization     surveillance program. It is not     intended to diagnose MRSA     infection nor to guide or     monitor treatment for     MRSA infections.  AFB CULTURE WITH SMEAR     Status: None   Collection Time    01/21/13  2:47 PM      Result Value Range Status   Specimen Description GASTRIC ASPIRATE   Final   Special Requests NONE   Final   ACID FAST SMEAR NO ACID FAST BACILLI SEEN   Final   Culture     Final   Value: CULTURE WILL BE EXAMINED FOR 6 WEEKS BEFORE ISSUING A FINAL REPORT   Report Status PENDING   Incomplete  AFB CULTURE WITH SMEAR     Status: None   Collection Time    01/23/13  5:00 AM      Result Value Range Status   Specimen Description SPUTUM   Final   Special Requests NONE   Final   ACID FAST  SMEAR NO ACID FAST BACILLI SEEN   Final   Culture     Final   Value: CULTURE WILL BE EXAMINED FOR 6 WEEKS BEFORE ISSUING A FINAL REPORT   Report Status PENDING   Incomplete  AFB CULTURE, BLOOD     Status: None   Collection Time    01/23/13  3:00 PM      Result Value Range Status   Specimen Description BLOOD LEFT ARM   Final   Special Requests 4CC   Final   Culture     Final   Value: CULTURE WILL BE EXAMINED FOR 6 WEEKS BEFORE ISSUING A FINAL REPORT   Report Status PENDING   Incomplete  AFB CULTURE WITH SMEAR     Status: None   Collection Time    01/24/13  9:06 AM      Result Value Range Status   Specimen Description SPUTUM   Final   Special Requests NONE   Final   ACID FAST SMEAR NO ACID FAST BACILLI SEEN   Final   Culture     Final   Value: CULTURE WILL BE EXAMINED FOR 6 WEEKS BEFORE ISSUING A FINAL REPORT   Report Status PENDING   Incomplete    Studies/Results: No results found.   Assessment/Plan: Cavitary lung lesions ? Hx of TB tx Quantiferon + Hepatitis C  My great appreciation to Dr Enid Skeens.  For Bronch in  AM I have contacted [facility] health dept to get past treatment records (when I ask pt about this he denies...) Total days of antibiotics: 0        [name] Infectious Diseases [phone] [email or web address] 01/26/2013, 12:08 PM   LOS: 6 days   Add: Spoke with health dept, he has not been treated in [employer].

## 2013-01-26 NOTE — Progress Notes (Signed)
Paged Dr. Delton Coombes to inform him that when I tried to get pt. To sign consent for bronchoscopy pt. Didn't understand what exactly was going to be done.  Return call from Dr. Delton Coombes and informed of above. Also told him that there will be a live interpretor here today at 6:15 if someone could come up to explained the procedure again.  Dr. Delton Coombes said he will work on that.  Will continue to monitor.  Forbes Cellar, RN

## 2013-01-26 NOTE — Progress Notes (Signed)
Pt still refuses IV.

## 2013-01-26 NOTE — Care Management Note (Signed)
    Page 1 of 1   01/30/2013     8:32:37 AM   CARE MANAGEMENT NOTE 01/30/2013  Patient:  Adam Horton, Adam Horton   Account Number:  [id number]  Date Initiated:  01/26/2013  Documentation initiated by:  Letha Cape  Subjective/Objective Assessment:   dx ? TB abd pain  admit- lives alone. pta indep.     Action/Plan:   Anticipated DC Date:  01/30/2013   Anticipated DC Plan:  HOME/SELF CARE      DC Planning Services  CM consult      Choice offered to / List presented to:             Status of service:  Completed, signed off Medicare Important Message given?   (If response is "NO", the following Medicare IM given date fields will be blank) Date Medicare IM given:   Date Additional Medicare IM given:    Discharge Disposition:  HOME/SELF CARE  Per UR Regulation:  Reviewed for med. necessity/level of care/duration of stay  If discussed at Long Length of Stay Meetings, dates discussed:    Comments:  01/30/13 8:06 Letha Cape RN, BSN [phone] per ID note, AFB negative, pt was dc 'd over w/e to home.  01/27/13 16:56 Letha Cape RN, BSN [phone] patient needs interpreter for communication, interpreter will be here Sat 5/31 at 3:20 to see if he is needed , then he will be here on Mon- Fri between 6:20 and 6:30 until patient is discharged to see if he is needed.  ( interpreter works til 5pm so this is why he comes at [employer] or 6:30- and he is the only [place] interpreter we have).  01/26/13 15:02 Letha Cape RN ,BSN [phone] patient lives alone, pta indep.  Patient has a positive Quenteferon,, pulomonary to see  bronch washing tomorrow. NCM has been in touch with Health Dept, [phone]. Patient speaks [place], called [place] at [facility] interpreter to set up meeting with patient, RN, Chaplain and who ever else needs to speak with patient.  Patient had TB in 2011, will need to know who treated him for this, will need to know about transportation home .  Per Consulting civil engineer, they have confirmed the  meeting for 6:15 this evening.

## 2013-01-26 NOTE — Consult Note (Signed)
PULMONARY  / CRITICAL CARE MEDICINE  Name: Adam Horton MRN: [id number] DOB: January 30, 1966    ADMISSION DATE:  01/20/2013 CONSULTATION DATE:  01/26/13  REFERRING MD :  Arthor Captain PRIMARY SERVICE:  [facility]  CHIEF COMPLAINT:  ?TB  BRIEF PATIENT DESCRIPTION: 47yo [place] male with hx HTN, DM, Hep C, previous hx TB in 2011 s/p 9 months rx per patient.  Admitted 5/23 by [facility] with abd pain, chest pain.  No clear etiology for abd pain but w/u revealed RUL opacities and PCCM consulted for FOB to assist with r/o TB.  Quant gold POS.   SIGNIFICANT EVENTS / STUDIES:  CT chest 5/23>>>No evidence of aortic dissection. Bilateral apical lung masses. Primary considerations include malignancy and TB. Small nodules and irregular opacities within the right upper lobe support the possibility of mycobacterium infection. Correlate clinically as for the need for workup.  LINES / TUBES: none  CULTURES:   ANTIBIOTICS: none  HISTORY OF PRESENT ILLNESS:  46yo [place] male with hx HTN, DM, Hep C, previous hx TB in 2011 s/p 9 months rx per patient.  Admitted 5/23 by [facility] with abd pain, chest pain.  No clear etiology for abd pain but w/u revealed RUL opacities and PCCM consulted for FOB to assist with r/o TB.  Quant gold POS.  Pt is pleasant and speaks english mod well.  He denies SOB and states chest pain is resolved.  Denies further abd pain.  Does state he had known TB in 2011 and took "some kind of medicine for about 9months".  Denies cough, hemoptysis, does have occasional night sweats.    PAST MEDICAL HISTORY :  Past Medical History  Diagnosis Date  . Diabetes mellitus without complication    Past Surgical History  Procedure Laterality Date  . Appendectomy    . Esophagogastroduodenoscopy N/A 01/21/2013    Procedure: ESOPHAGOGASTRODUODENOSCOPY (EGD);  Surgeon: Hart Carwin, MD;  Location: [facility] ENDOSCOPY;  Service: Endoscopy;  Laterality: N/A;   Prior to Admission medications   Medication Sig Start Date End  Date Taking? Authorizing Provider  insulin detemir (LEVEMIR) 100 UNIT/ML injection Inject 58 Units into the skin daily.   Yes Historical Provider, MD  lisinopril (PRINIVIL,ZESTRIL) 5 MG tablet Take 2.5 mg by mouth daily.   Yes Historical Provider, MD  metformin (FORTAMET) 500 MG (OSM) 24 hr tablet Take 1,000 mg by mouth 2 (two) times daily with a meal.   Yes Historical Provider, MD  metoCLOPramide (REGLAN) 10 MG tablet Take 1 tablet (10 mg total) by mouth every 6 (six) hours. As needed for nausea or vomiting 01/16/13  Yes John-Adam Bonk, MD  naproxen (NAPROSYN) 500 MG tablet Take 500 mg by mouth 2 (two) times daily as needed (pain).    Yes Historical Provider, MD  omeprazole (PRILOSEC) 20 MG capsule Take 20 mg by mouth daily as needed (for stomach pain).   Yes Historical Provider, MD  OVER THE COUNTER MEDICATION Place 1 drop into the left eye 2 (two) times daily as needed (otc eye drop prn pain).   Yes Historical Provider, MD  pantoprazole (PROTONIX) 40 MG tablet Take 40 mg by mouth daily.   Yes Historical Provider, MD  pyridOXINE (VITAMIN B-6) 100 MG tablet Take 100 mg by mouth daily.   Yes Historical Provider, MD  sucralfate (CARAFATE) 1 G tablet Take 1 tablet (1 g total) by mouth 4 (four) times daily. 30 minutes before eating 01/16/13  Yes John-Adam Bonk, MD   No Known Allergies  FAMILY HISTORY:  History reviewed.  No pertinent family history. SOCIAL HISTORY:  reports that he has never smoked. He does not have any smokeless tobacco history on file. He reports that he does not drink alcohol or use illicit drugs.  REVIEW OF SYSTEMS:   Per HPI - all other systems reviewed and were neg.   VITAL SIGNS: Temp:  [97.8 F (36.6 C)-98.5 F (36.9 C)] 98.3 F (36.8 C) (05/29 0521) Pulse Rate:  [65-73] 73 (05/29 0521) Resp:  [18-20] 18 (05/29 0521) BP: (94-132)/(62-80) 94/62 mmHg (05/29 0521) SpO2:  [97 %-100 %] 97 % (05/29 0521)  PHYSICAL EXAMINATION: General:  Thin, pleasant male, NAD Neuro:   Awake, alert, appropriate, MAE HEENT:  Mm dry, no JVD, no LA Cardiovascular:  s1s2 rrr Lungs:  resps even non labored on RA, few scattered ronchi otherwise cta  Abdomen:  Soft, non tender, +bs Ext: warm and dry, no edema    Recent Labs Lab 01/21/13 0520 01/23/13 0610 01/26/13 0525  NA 140 142 139  K 3.6 3.5 3.7  CL 104 108 104  CO2 24 21 22   BUN 8 12 10   CREATININE 1.06 0.97 1.10  GLUCOSE 117* 101* 209*    Recent Labs Lab 01/23/13 0610 01/25/13 0518 01/26/13 0525  HGB 13.0 12.5* 11.8*  HCT 38.9* 38.2* 36.0*  WBC 4.0 3.2* 4.2  PLT 204 214 198   No results found.  ASSESSMENT / PLAN:  RUL apical opacity - could be new TB, could be scarring from previous. Hx TB 2011.  Was in [place] at that time.    PLAN -  FOB for washings/AFB - will plan for am 5/30 730am  NPO p MN  Cont airborne precautions  ID following   WHITEHEART,KATHRYN, NP 01/26/2013  9:49 AM Pager: [phone] or [phone]  *Care during the described time interval was provided by me and/or other providers on the critical care team. I have reviewed this patient's available data, including medical history, events of note, physical examination and test results as part of my evaluation.  Levy Pupa, MD, PhD 01/26/2013, 10:47 AM [facility] [phone] or if no answer [phone]

## 2013-01-26 NOTE — Progress Notes (Signed)
Received call from Jorene Minors, RN in infection prevention and need RN to see what state the pt. First entered the [place].  Used interpretor [id number] to ask pt., he stated it was [place].  Pt. Stated he was treated for TB in 2011 and stayed in the hospital for 4 weeks and was treated for 9 months. Pt. Didn't know the name of the hospital but it was in [place]. Call Jorene Minors back and informed her of above.  Will continue to monitor.  Forbes Cellar, RN

## 2013-01-26 NOTE — Progress Notes (Signed)
PATIENT DETAILS Name: Adam Horton Age: 47 y.o. Sex: male Date of Birth: [birth year] Admit Date: 01/20/2013 Admitting Physician Dewayne Shorter Levora Dredge, MD PCP:No PCP Per Patient  Brief summary Patient is a 47 year old [place] male with a past medical history of hypertension, diabetes,? Hepatitis C who presented to the emergency room on 5/23 with diffuse abdominal and chest pain for a week. This was his third emergency room visit. He already had 2 CT scans of his abdomen prior to this visit. Given ongoing chest and abdominal pain, patient was admitted to the step down unit, he had elevated lactic acid levels on admission therefore a CT angiogram of the chest and abdomen was pursued which showed a normal vascular anatomy, however the CTA of the chest showed bilateral pulmonary apical lesions consistent with perhaps tuberculosis. GI was consulted, EGD was positive for gastritis however it was felt that this was not responsible for his severe pain. His pain has improved with supportive care including PPI, he is currently on airborne isolation and we are awaiting sputum AFB.Infectious disease has been consulted.  Subjective: Denies abdominal pain, no fever or chills per  Assessment/Plan: Principal Problem:  Abdominal pain -Etiology remains unclear-but clearly better and continues to improve daily.  -Did have mild lipase elevation on admission, however suspect pancreas not to be the etiology  -EGD 5/24-showed gastritis-do not think this still explains his abdominal pain -CT Angio Chest/Abdomen-no acute abnormalities to explain this amount of pain. -check ESR only 8, so doubt a chronic inflammatory condition -??anxiety was playing a role -Minimize Narcotics  Active Problems: B/L upper lobe lesion in lung -?TB, ID service is following. - Gastric aspirate neg for AFB -HIV negative, he was treated for tuberculosis (I don't know exactly if was active or LTBI) in 2011 - Positive Quantiferon-tb gold,  pulmonology consulted, FOB for BAL+/- biopsy if accessible. -Appreciate Dr. Ninetta Lights and Dr. Delton Coombes input  Intermittent minimal rectal bleeding - Suspected hemorrhoidal etiology- on Anusol - Check CBC in a.m.  Hep C -Confirmed by the qualitative HCV PCR  -LFT's stable  DM (diabetes mellitus) -CBG's stable -c/w SSI -resumed Levemir at reduced dose-will increase to 15 units  HTN (hypertension) -resume Lisinopril  Disposition: Remain inpatient, continue airborne isolation  DVT Prophylaxis: Start prophylactic Lovenox  Code Status: Full code   Family Communication None at bedside  Procedures:  None  CONSULTS:  GI   MEDICATIONS: Scheduled Meds: . enoxaparin (LOVENOX) injection  40 mg Subcutaneous Q24H  . hydrocortisone  25 mg Rectal BID  . insulin aspart  0-9 Units Subcutaneous TID WC  . insulin detemir  15 Units Subcutaneous QHS  . lisinopril  2.5 mg Oral Daily  . pantoprazole  40 mg Oral BID AC  . sodium chloride  3 mL Intravenous Q12H   Continuous Infusions:   PRN Meds:.acetaminophen, acetaminophen, albuterol, diphenhydrAMINE, gi cocktail, ondansetron (ZOFRAN) IV, ondansetron, oxyCODONE  Antibiotics: Anti-infectives   None       PHYSICAL EXAM: Vital signs in last 24 hours: Filed Vitals:   01/25/13 1437 01/25/13 2104 01/26/13 0521 01/26/13 1218  BP: 112/77 117/76 94/62 122/78  Pulse: 71 65 73   Temp: 97.8 F (36.6 C) 98.5 F (36.9 C) 98.3 F (36.8 C)   TempSrc: Oral Oral Oral   Resp: 18 20 18    Height:      Weight:      SpO2: 100% 99% 97%     Weight change:  Filed Weights   01/20/13 2000  Weight: 60.6 kg (133 lb 9.6  oz)   Body mass index is 21.57 kg/(m^2).   Gen Exam: Awake and alert with clear speech.   Neck: Supple, No JVD.   Chest: B/L Clear.   CVS: S1 S2 Regular, no murmurs.  Abdomen: soft, BS +, mildly tender in epigastric, non distended. No rebound Extremities: no edema, lower extremities warm to touch Neurologic: Non Focal.    Skin: No Rash.   Wounds: N/A.    Intake/Output from previous day:  Intake/Output Summary (Last 24 hours) at 01/26/13 1239 Last data filed at 01/26/13 1100  Gross per 24 hour  Intake    480 ml  Output      0 ml  Net    480 ml     LAB RESULTS: CBC  Recent Labs Lab 01/20/13 1409 01/21/13 0520 01/23/13 0610 01/25/13 0518 01/26/13 0525  WBC 4.6 4.7 4.0 3.2* 4.2  HGB 14.3 12.8* 13.0 12.5* 11.8*  HCT 41.6 38.5* 38.9* 38.2* 36.0*  PLT 248 230 204 214 198  MCV 71.2* 72.0* 71.2* 71.3* 72.0*  MCH 24.5* 23.9* 23.8* 23.3* 23.6*  MCHC 34.4 33.2 33.4 32.7 32.8  RDW 13.6 13.6 13.7 13.7 14.0  LYMPHSABS 1.0  --   --   --   --   MONOABS 0.1  --   --   --   --   EOSABS 0.0  --   --   --   --   BASOSABS 0.0  --   --   --   --     Chemistries   Recent Labs Lab 01/20/13 1409 01/21/13 0520 01/23/13 0610 01/26/13 0525  NA 131* 140 142 139  K 5.2* 3.6 3.5 3.7  CL 98 104 108 104  CO2 21 24 21 22   GLUCOSE 254* 117* 101* 209*  BUN 10 8 12 10   CREATININE 1.04 1.06 0.97 1.10  CALCIUM 9.7 8.7 9.1 9.2    CBG:  Recent Labs Lab 01/25/13 0820 01/25/13 1218 01/25/13 1716 01/25/13 2159 01/26/13 0812  GLUCAP 133* 113* 170* 137* 152*    GFR Estimated Creatinine Clearance: 71.9 ml/min (by C-G formula based on Cr of 1.1).  Coagulation profile No results found for this basename: INR, PROTIME,  in the last 168 hours  Cardiac Enzymes  Recent Labs Lab 01/20/13 1825 01/20/13 2343 01/21/13 0520  TROPONINI <0.30 <0.30 <0.30    No components found with this basename: POCBNP,  No results found for this basename: DDIMER,  in the last 72 hours No results found for this basename: HGBA1C,  in the last 72 hours No results found for this basename: CHOL, HDL, LDLCALC, TRIG, CHOLHDL, LDLDIRECT,  in the last 72 hours No results found for this basename: TSH, T4TOTAL, FREET3, T3FREE, THYROIDAB,  in the last 72 hours No results found for this basename: VITAMINB12, FOLATE, FERRITIN, TIBC,  IRON, RETICCTPCT,  in the last 72 hours No results found for this basename: LIPASE, AMYLASE,  in the last 72 hours  Urine Studies No results found for this basename: UACOL, UAPR, USPG, UPH, UTP, UGL, UKET, UBIL, UHGB, UNIT, UROB, ULEU, UEPI, UWBC, URBC, UBAC, CAST, CRYS, UCOM, BILUA,  in the last 72 hours  MICROBIOLOGY: Recent Results (from the past 240 hour(s))  MRSA PCR SCREENING     Status: None   Collection Time    01/20/13  8:16 PM      Result Value Range Status   MRSA by PCR NEGATIVE  NEGATIVE Final   Comment:  The GeneXpert MRSA Assay (FDA     approved for NASAL specimens     only), is one component of a     comprehensive MRSA colonization     surveillance program. It is not     intended to diagnose MRSA     infection nor to guide or     monitor treatment for     MRSA infections.  AFB CULTURE WITH SMEAR     Status: None   Collection Time    01/21/13  2:47 PM      Result Value Range Status   Specimen Description GASTRIC ASPIRATE   Final   Special Requests NONE   Final   ACID FAST SMEAR NO ACID FAST BACILLI SEEN   Final   Culture     Final   Value: CULTURE WILL BE EXAMINED FOR 6 WEEKS BEFORE ISSUING A FINAL REPORT   Report Status PENDING   Incomplete  AFB CULTURE WITH SMEAR     Status: None   Collection Time    01/23/13  5:00 AM      Result Value Range Status   Specimen Description SPUTUM   Final   Special Requests NONE   Final   ACID FAST SMEAR NO ACID FAST BACILLI SEEN   Final   Culture     Final   Value: CULTURE WILL BE EXAMINED FOR 6 WEEKS BEFORE ISSUING A FINAL REPORT   Report Status PENDING   Incomplete  AFB CULTURE, BLOOD     Status: None   Collection Time    01/23/13  3:00 PM      Result Value Range Status   Specimen Description BLOOD LEFT ARM   Final   Special Requests 4CC   Final   Culture     Final   Value: CULTURE WILL BE EXAMINED FOR 6 WEEKS BEFORE ISSUING A FINAL REPORT   Report Status PENDING   Incomplete  AFB CULTURE WITH SMEAR      Status: None   Collection Time    01/24/13  9:06 AM      Result Value Range Status   Specimen Description SPUTUM   Final   Special Requests NONE   Final   ACID FAST SMEAR NO ACID FAST BACILLI SEEN   Final   Culture     Final   Value: CULTURE WILL BE EXAMINED FOR 6 WEEKS BEFORE ISSUING A FINAL REPORT   Report Status PENDING   Incomplete    RADIOLOGY STUDIES/RESULTS: Ct Angio Chest Pe W/cm &/or Wo Cm  01/20/2013   *RADIOLOGY REPORT*  Clinical Data:  Chest pain.  Abdominal pain.  CT ANGIOGRAPHY CHEST, ABDOMEN AND PELVIS  Technique:  Multidetector CT imaging through the chest, abdomen and pelvis was performed using the standard protocol during bolus administration of intravenous contrast.  Multiplanar reconstructed images including MIPs were obtained and reviewed to evaluate the vascular anatomy.  Contrast: OMNIPAQUE IOHEXOL 350 MG/ML SOLN  Comparison:  01/16/2013  CTA CHEST  Findings:  There is no evidence of aortic aneurysm, aortic dissection, or aortic transection.  Innominate artery, right subclavian artery, right common carotid artery, left common carotid artery, left subclavian artery, and bilateral vertebral arteries are patent.  No pericardial effusion.  No abnormal adenopathy by measurement criteria.  No obvious filling defect in the pulmonary arterial tree to suggest acute pulmonary thromboembolism.  There is mild wall thickening of the distal esophagus just above the GE junction.  No pneumothorax.  No pleural effusion.  Pleural-based  2.2 x 1.2 cm mass at the right apex associated with surrounding micronodules.  There are other irregular, triangular, and patchy opacities within the right upper lobe. 8 mm spiculated opacity in the left upper lobe towards the apex on image 37.  Dependent atelectasis at the lung bases.  No acute bony deformity.   Review of the MIP images confirms the above findings.  IMPRESSION: No evidence of aortic dissection.  Bilateral apical lung masses.  Primary  considerations include malignancy and TB.  Small nodules and irregular opacities within the right upper lobe support the possibility of mycobacterium infection.  Correlate clinically as for the need for workup.  CTA ABDOMEN AND PELVIS  Findings:  No evidence of aortic dissection or aortic aneurysm.  Celiac, SMA, and IMA are patent.  Branch vessels are patent. Single renal arteries are patent.  Bilateral internal, external, and common iliac arteries are patent.  Multiple small foci of arterial enhancement within the liver are present.  Given history of hepatitis C, hepatic cellular carcinoma is not excluded.  Gallbladder, pancreas, spleen, adrenal glands, kidneys are stable in appearance.  Appendix is not visualized.  Bladder is within normal limits.  No free fluid.  No obvious abnormal adenopathy.  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  Bilateral L5 nerve root encroachment at this level.   Review of the MIP images confirms the above findings.  IMPRESSION: No evidence of aortic dissection or acute arterial abnormality.  Multiple foci of arterial enhancement throughout the liver.  Given history of hepatitis C, hepatic cellular carcinoma is not excluded. MRI is recommended to further characterize.  Otherwise stable exam.   Original Report Authenticated By: Jolaine Click, M.D.   US Abdomen Complete  01/16/2013   *RADIOLOGY REPORT*  Clinical Data:  Lower quadrant abdominal pain.  COMPLETE ABDOMINAL ULTRASOUND  Comparison:  CT scan 01/14/2013.  Findings:  Gallbladder:  Echogenic sludge noted layering in the gallbladder. No gallbladder wall thickening, pericholecystic fluid or definite gallstones.  Common bile duct:  Normal in caliber measuring a maximum of 4.63mm.  Liver:  There is diffuse increased echogenicity of the liver and decreased through transmission consistent with fatty infiltration. No focal lesions or biliary dilatation.  IVC:  Normal caliber.  Pancreas:  Not well visualized due to overlying  bowel gas but was normal on the recent CT scan.  Spleen:  Normal size and echogenicity without focal lesions.  Right Kidney:  10.0 cm in length. Normal renal cortical thickness and echogenicity without focal lesions or hydronephrosis.  Left Kidney:  10.8 cm in length. Normal renal cortical thickness and echogenicity without focal lesions or hydronephrosis.  Abdominal aorta:  Normal.  IMPRESSION:  1.  Layering echogenic sludge in the gallbladder but no definite gallstones or findings for acute cholecystitis. 2.  Normal caliber common bile duct. 3.  Diffuse fatty infiltration of the liver. 4.  Poor visualization of the pancreas.   Original Report Authenticated By: Rudie Meyer, M.D.   Ct Abdomen Pelvis W Contrast  01/16/2013   *RADIOLOGY REPORT*  Clinical Data: Epigastric pain.  CT ABDOMEN AND PELVIS WITH CONTRAST  Technique:  Multidetector CT imaging of the abdomen and pelvis was performed following the standard protocol during bolus administration of intravenous contrast.  Contrast: OMNIPAQUE IOHEXOL 300 MG/ML  SOLN  Comparison: 01/14/2013.  Findings: The lung bases are clear except for dependent atelectasis.  Mild diffuse fatty infiltration of the liver but no focal hepatic lesions or intrahepatic biliary dilatation.  Gallbladder is grossly normal.  No common bile duct dilatation.  The pancreas is normal. The spleen is normal.  The adrenal glands and kidneys are normal.  The stomach, duodenum, small bowel and colon are unremarkable.  No inflammatory changes or mass lesions.  The appendix is surgically absent.  No mesenteric or retroperitoneal mass or adenopathy.  The aorta is normal in caliber.  The major branch vessels are patent.  The bladder is distended almost to the level of the umbilicus.  No pelvic mass, adenopathy or free pelvic fluid collections.  The prostate gland and seminal vesicles are unremarkable.  No inguinal mass or hernia.  The bony pelvis is intact.  I again noted are bilateral pars  defects at L5 with a grade 1 spondylolisthesis.  IMPRESSION:  1.  Distended bladder almost to the level of the umbilicus. 2.  No other significant abdominal/pelvic findings demonstrated.   Original Report Authenticated By: Rudie Meyer, M.D.   Ct Abdomen Pelvis W Contrast  01/14/2013   *RADIOLOGY REPORT*  Clinical Data: Chest pain.  Abdominal pain.  CT ABDOMEN AND PELVIS WITH CONTRAST  Technique:  Multidetector CT imaging of the abdomen and pelvis was performed following the standard protocol during bolus administration of intravenous contrast.  Contrast: OMNIPAQUE IOHEXOL 300 MG/ML  SOLN, 50mL OMNIPAQUE IOHEXOL 300 MG/ML  SOLN  Comparison: 09/19/2012  Findings: Diffuse hepatic steatosis.  Gallbladder, spleen, pancreas, adrenal glands, kidneys are within normal limits.  Bladder is distended.  Unremarkable prostate.  No free fluid.  No abnormal adenopathy.  Post appendectomy clips.  Grade 1 L5-S1 spondylolisthesis.  IMPRESSION: No acute intra-abdominal or intrapelvic pathology.  Stable.   Original Report Authenticated By: Jolaine Click, M.D.   Dg Chest Portable 1 View  01/14/2013   *RADIOLOGY REPORT*  Clinical Data: Short of breath and cough  PORTABLE CHEST - 1 VIEW  Comparison: 11/21/2010  Findings: Heart is normal in size.  Lungs are clear.  No pleural effusion.  No pneumothorax.  No acute bony deformity.  IMPRESSION: No active cardiopulmonary disease.   Original Report Authenticated By: Jolaine Click, M.D.   Dg Abd Acute W/chest  01/20/2013   *RADIOLOGY REPORT*  Clinical Data: Abdominal pain  ACUTE ABDOMEN SERIES (ABDOMEN 2 VIEW & CHEST 1 VIEW)  Comparison: CT abdomen pelvis dated 01/16/2013  Findings: Lungs are essentially clear.  No focal consolidation.  No pleural effusion or pneumothorax.  Nonobstructive bowel gas pattern.  No evidence of free air under the diaphragm on the upright view.  Visualized osseous structures are within normal limits.  IMPRESSION: No evidence of acute cardiopulmonary  disease.  No evidence of small bowel obstruction or free air.   Original Report Authenticated By: Charline Bills, M.D.   Ct Angio Abd/pel W/ And/or W/o  01/20/2013   *RADIOLOGY REPORT*  Clinical Data:  Chest pain.  Abdominal pain.  CT ANGIOGRAPHY CHEST, ABDOMEN AND PELVIS  Technique:  Multidetector CT imaging through the chest, abdomen and pelvis was performed using the standard protocol during bolus administration of intravenous contrast.  Multiplanar reconstructed images including MIPs were obtained and reviewed to evaluate the vascular anatomy.  Contrast: OMNIPAQUE IOHEXOL 350 MG/ML SOLN  Comparison:  01/16/2013  CTA CHEST  Findings:  There is no evidence of aortic aneurysm, aortic dissection, or aortic transection.  Innominate artery, right subclavian artery, right common carotid artery, left common carotid artery, left subclavian artery, and bilateral vertebral arteries are patent.  No pericardial effusion.  No abnormal adenopathy by measurement criteria.  No obvious filling defect in the pulmonary  arterial tree to suggest acute pulmonary thromboembolism.  There is mild wall thickening of the distal esophagus just above the GE junction.  No pneumothorax.  No pleural effusion.  Pleural-based 2.2 x 1.2 cm mass at the right apex associated with surrounding micronodules.  There are other irregular, triangular, and patchy opacities within the right upper lobe. 8 mm spiculated opacity in the left upper lobe towards the apex on image 37.  Dependent atelectasis at the lung bases.  No acute bony deformity.   Review of the MIP images confirms the above findings.  IMPRESSION: No evidence of aortic dissection.  Bilateral apical lung masses.  Primary considerations include malignancy and TB.  Small nodules and irregular opacities within the right upper lobe support the possibility of mycobacterium infection.  Correlate clinically as for the need for workup.  CTA ABDOMEN AND PELVIS  Findings:  No evidence of  aortic dissection or aortic aneurysm.  Celiac, SMA, and IMA are patent.  Branch vessels are patent. Single renal arteries are patent.  Bilateral internal, external, and common iliac arteries are patent.  Multiple small foci of arterial enhancement within the liver are present.  Given history of hepatitis C, hepatic cellular carcinoma is not excluded.  Gallbladder, pancreas, spleen, adrenal glands, kidneys are stable in appearance.  Appendix is not visualized.  Bladder is within normal limits.  No free fluid.  No obvious abnormal adenopathy.  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  Bilateral L5 nerve root encroachment at this level.   Review of the MIP images confirms the above findings.  IMPRESSION: No evidence of aortic dissection or acute arterial abnormality.  Multiple foci of arterial enhancement throughout the liver.  Given history of hepatitis C, hepatic cellular carcinoma is not excluded. MRI is recommended to further characterize.  Otherwise stable exam.   Original Report Authenticated By: Jolaine Click, M.D.    Clint Lipps, MD  [facility] Pager:[phone]  If 7PM-7AM, please contact night-coverage [email or web address] Password [facility] 01/26/2013, 12:39 PM   LOS: 6 days

## 2013-01-27 ENCOUNTER — Encounter (HOSPITAL_COMMUNITY): Payer: Self-pay | Admitting: Emergency Medicine

## 2013-01-27 ENCOUNTER — Inpatient Hospital Stay (HOSPITAL_COMMUNITY): Payer: BC Managed Care – PPO

## 2013-01-27 ENCOUNTER — Encounter (HOSPITAL_COMMUNITY)
Admission: EM | Disposition: A | Discharge status: Home / Self Care | Payer: Self-pay | Source: Home / Self Care | Attending: Internal Medicine

## 2013-01-27 DIAGNOSIS — R911 Solitary pulmonary nodule: Secondary | ICD-10-CM | POA: Diagnosis present

## 2013-01-27 HISTORY — PX: VIDEO BRONCHOSCOPY: SHX5072

## 2013-01-27 LAB — BASIC METABOLIC PANEL
Chloride: 103 mEq/L (ref 96–112)
Creatinine, Ser: 0.9 mg/dL (ref 0.50–1.35)
GFR calc Af Amer: >90 mL/min (ref >90)
Potassium: 3.8 mEq/L (ref 3.5–5.1)

## 2013-01-27 LAB — GLUCOSE, CAPILLARY
Glucose-Capillary: 98 mg/dL (ref 70–99)
Glucose-Capillary: 179 mg/dL — ABNORMAL HIGH (ref 70–99)
Glucose-Capillary: 197 mg/dL — ABNORMAL HIGH (ref 70–99)
Glucose-Capillary: 176 mg/dL — ABNORMAL HIGH (ref 70–99)

## 2013-01-27 LAB — APTT: aPTT: 35 seconds (ref 24–37)

## 2013-01-27 LAB — PROTIME-INR: INR: 1.04 (ref 0.00–1.49)

## 2013-01-27 SURGERY — VIDEO BRONCHOSCOPY WITHOUT FLUORO
Anesthesia: Moderate Sedation | Laterality: Bilateral

## 2013-01-27 MED ORDER — MIDAZOLAM HCL 10 MG/2ML IJ SOLN
INTRAMUSCULAR | Status: DC | PRN
  Administered 2013-01-27: 2 mg via INTRAVENOUS

## 2013-01-27 MED ORDER — MIDAZOLAM HCL 5 MG/ML IJ SOLN
INTRAMUSCULAR | Status: AC
  Filled 2013-01-27: qty 2

## 2013-01-27 MED ORDER — PHENYLEPHRINE HCL 0.25 % NA SOLN: 1.0000 | Freq: Four times a day (QID) | NASAL | Status: DC | PRN

## 2013-01-27 MED ORDER — FENTANYL CITRATE 0.05 MG/ML IJ SOLN
INTRAMUSCULAR | Status: AC
  Filled 2013-01-27: qty 4

## 2013-01-27 MED ORDER — LIDOCAINE HCL (PF) 1 % IJ SOLN
INTRAMUSCULAR | Status: DC | PRN
  Administered 2013-01-27: 5 mL

## 2013-01-27 MED ORDER — LIDOCAINE HCL 2 % EX GEL
CUTANEOUS | Status: DC | PRN
  Administered 2013-01-27: 1

## 2013-01-27 MED ORDER — LIDOCAINE HCL 2 % EX GEL: Freq: Once | CUTANEOUS | Status: DC

## 2013-01-27 MED ORDER — PHENYLEPHRINE HCL 0.25 % NA SOLN
NASAL | Status: DC | PRN
  Administered 2013-01-27: 2 via NASAL

## 2013-01-27 MED ORDER — FENTANYL CITRATE 0.05 MG/ML IJ SOLN
INTRAMUSCULAR | Status: DC | PRN
  Administered 2013-01-27: 50 ug via INTRAVENOUS

## 2013-01-27 NOTE — Progress Notes (Signed)
INFECTIOUS DISEASE PROGRESS NOTE  ID: Adam Horton is a 47 y.o. male with   Principal Problem:   Abdominal pain Active Problems:   DM (diabetes mellitus)   HTN (hypertension)   Nonspecific (abnormal) findings on radiological and other examination of biliary tract   Other abnormal blood chemistry  Subjective: Without complaints  Abtx:  Anti-infectives   None      Medications:  Scheduled: . enoxaparin (LOVENOX) injection  40 mg Subcutaneous Q24H  . hydrocortisone  25 mg Rectal BID  . insulin aspart  0-9 Units Subcutaneous TID WC  . insulin detemir  15 Units Subcutaneous QHS  . lisinopril  2.5 mg Oral Daily  . pantoprazole  40 mg Oral BID AC  . sodium chloride  3 mL Intravenous Q12H    Objective: Vital signs in last 24 hours: Temp:  [98.1 F (36.7 C)-98.3 F (36.8 C)] 98.2 F (36.8 C) (05/30 0930) Pulse Rate:  [64-85] 64 (05/30 0438) Resp:  [16-51] 21 (05/30 0930) BP: (108-171)/(67-101) 115/80 mmHg (05/30 0930) SpO2:  [95 %-100 %] 99 % (05/30 0930)   General appearance: alert, cooperative and no distress Resp: clear to auscultation bilaterally Cardio: regular rate and rhythm GI: normal findings: bowel sounds normal and soft, non-tender NAD  Lab Results  Recent Labs  01/25/13 0518 01/26/13 0525 01/27/13 0440  WBC 3.2* 4.2  --   HGB 12.5* 11.8*  --   HCT 38.2* 36.0*  --   NA  --  139 138  K  --  3.7 3.8  CL  --  104 103  CO2  --  22 27  BUN  --  10 10  CREATININE  --  1.10 0.90   Liver Panel No results found for this basename: PROT, ALBUMIN, AST, ALT, ALKPHOS, BILITOT, BILIDIR, IBILI,  in the last 72 hours Sedimentation Rate No results found for this basename: ESRSEDRATE,  in the last 72 hours C-Reactive Protein No results found for this basename: CRP,  in the last 72 hours  Microbiology: Recent Results (from the past 240 hour(s))  MRSA PCR SCREENING     Status: None   Collection Time    01/20/13  8:16 PM      Result Value Range Status   MRSA by PCR NEGATIVE  NEGATIVE Final   Comment:            The GeneXpert MRSA Assay (FDA     approved for NASAL specimens     only), is one component of a     comprehensive MRSA colonization     surveillance program. It is not     intended to diagnose MRSA     infection nor to guide or     monitor treatment for     MRSA infections.  AFB CULTURE WITH SMEAR     Status: None   Collection Time    01/21/13  2:47 PM      Result Value Range Status   Specimen Description GASTRIC ASPIRATE   Final   Special Requests NONE   Final   ACID FAST SMEAR NO ACID FAST BACILLI SEEN   Final   Culture     Final   Value: CULTURE WILL BE EXAMINED FOR 6 WEEKS BEFORE ISSUING A FINAL REPORT   Report Status PENDING   Incomplete  AFB CULTURE WITH SMEAR     Status: None   Collection Time    01/23/13  5:00 AM      Result Value Range Status  Specimen Description SPUTUM   Final   Special Requests NONE   Final   ACID FAST SMEAR NO ACID FAST BACILLI SEEN   Final   Culture     Final   Value: CULTURE WILL BE EXAMINED FOR 6 WEEKS BEFORE ISSUING A FINAL REPORT   Report Status PENDING   Incomplete  AFB CULTURE, BLOOD     Status: None   Collection Time    01/23/13  3:00 PM      Result Value Range Status   Specimen Description BLOOD LEFT ARM   Final   Special Requests 4CC   Final   Culture     Final   Value: CULTURE WILL BE EXAMINED FOR 6 WEEKS BEFORE ISSUING A FINAL REPORT   Report Status PENDING   Incomplete  AFB CULTURE WITH SMEAR     Status: None   Collection Time    01/24/13  9:06 AM      Result Value Range Status   Specimen Description SPUTUM   Final   Special Requests NONE   Final   ACID FAST SMEAR NO ACID FAST BACILLI SEEN   Final   Culture     Final   Value: CULTURE WILL BE EXAMINED FOR 6 WEEKS BEFORE ISSUING A FINAL REPORT   Report Status PENDING   Incomplete    Studies/Results: No results found.   Assessment/Plan: Pulmonary TB:  Culture+ [place], [place]. Treated April 2012--> December-2012  (extended course due to intolerance of PZA). [facility], ct with nodules, cavities [phone]) My great appreciation to Dr Delton Coombes.  Would await BAL- if AFB stain is (-), d/c pt home with f/u in ID clinic  [name] Infectious Diseases [phone] [email or web address] 01/27/2013, 11:26 AM   LOS: 7 days

## 2013-01-27 NOTE — Progress Notes (Signed)
Video bronchoscopy with washing intervention  Adam Pupa, MD, PhD 01/27/2013, 8:48 AM [facility] [phone] or if no answer [phone]

## 2013-01-27 NOTE — Progress Notes (Signed)
PATIENT DETAILS Name: Adam Horton Age: 47 y.o. Sex: male Date of Birth: April 04, 1966 Admit Date: 01/20/2013 Admitting Physician Dewayne Shorter Levora Dredge, MD PCP:No PCP Per Patient  Brief summary Patient is a 47 year old [place] male with a past medical history of hypertension, diabetes,? Hepatitis C who presented to the emergency room on 5/23 with diffuse abdominal and chest pain for a week. This was his third emergency room visit. He already had 2 CT scans of his abdomen prior to this visit. Given ongoing chest and abdominal pain, patient was admitted to the step down unit, he had elevated lactic acid levels on admission therefore a CT angiogram of the chest and abdomen was pursued which showed a normal vascular anatomy, however the CTA of the chest showed bilateral pulmonary apical lesions consistent with perhaps tuberculosis. GI was consulted, EGD was positive for gastritis however it was felt that this was not responsible for his severe pain. His pain has improved with supportive care including PPI, he is currently on airborne isolation and we are awaiting sputum AFB.Infectious disease has been consulted.  Subjective: Seen after his bronchoscopy, feels okay. York Spaniel he got treated for TB in 2011 in [place] [facility])  Assessment/Plan: Principal Problem:  B/L upper lobe lesion in lung -?TB, positive Quantiferon-tb gold assay but negative sputum for AFB -Gastric aspirate neg for AFB,Quantiferon-tb gold assay can be positive or there is any previous exposure to MTB -HIV negative, he was treated for tuberculosis (Not clear if it was active or LTBI) in 2011 in [place], [place]. -Appreciate Dr. Ninetta Lights and Dr. Delton Coombes input. -FOB done with BAL specimens sent to the lab.  Abdominal pain -Etiology remains unclear-but clearly better and continues to improve daily.  -Did have mild lipase elevation on admission, however suspect pancreas not to be the etiology  -EGD 5/24-showed gastritis-do not think  this still explains his abdominal pain -CT Angio Chest/Abdomen-no acute abnormalities to explain this amount of pain. -check ESR only 8, so doubt a chronic inflammatory condition -??anxiety was playing a role -Minimize Narcotics  Intermittent minimal rectal bleeding - Suspected hemorrhoidal etiology- on Anusol - Check CBC in a.m.  Hep C -Confirmed by the qualitative HCV PCR  -LFT's stable  DM (diabetes mellitus) -CBG's stable -c/w SSI -resumed Levemir at reduced dose-will increase to 15 units  HTN (hypertension) -resume Lisinopril  Disposition: Remain inpatient, continue airborne isolation  DVT Prophylaxis: Start prophylactic Lovenox  Code Status: Full code   Family Communication None at bedside  Procedures:  None  CONSULTS:  GI   MEDICATIONS: Scheduled Meds: . enoxaparin (LOVENOX) injection  40 mg Subcutaneous Q24H  . hydrocortisone  25 mg Rectal BID  . insulin aspart  0-9 Units Subcutaneous TID WC  . insulin detemir  15 Units Subcutaneous QHS  . lisinopril  2.5 mg Oral Daily  . pantoprazole  40 mg Oral BID AC  . sodium chloride  3 mL Intravenous Q12H   Continuous Infusions:   PRN Meds:.acetaminophen, acetaminophen, albuterol, diphenhydrAMINE, gi cocktail, ondansetron (ZOFRAN) IV, ondansetron, oxyCODONE  Antibiotics: Anti-infectives   None       PHYSICAL EXAM: Vital signs in last 24 hours: Filed Vitals:   01/27/13 0805 01/27/13 0810 01/27/13 0815 01/27/13 0930  BP: 121/79 139/75 127/80 115/80  Pulse:      Temp:    98.2 F (36.8 C)  TempSrc:    Oral  Resp: 21 26 22 21   Height:      Weight:      SpO2: 97% 95% 98% 99%  Weight change:  Filed Weights   01/20/13 2000  Weight: 60.6 kg (133 lb 9.6 oz)   Body mass index is 21.57 kg/(m^2).   Gen Exam: Awake and alert with clear speech.   Neck: Supple, No JVD.   Chest: B/L Clear.   CVS: S1 S2 Regular, no murmurs.  Abdomen: soft, BS +, mildly tender in epigastric, non distended. No  rebound Extremities: no edema, lower extremities warm to touch Neurologic: Non Focal.   Skin: No Rash.   Wounds: N/A.    Intake/Output from previous day:  Intake/Output Summary (Last 24 hours) at 01/27/13 1218 Last data filed at 01/27/13 0959  Gross per 24 hour  Intake    160 ml  Output      0 ml  Net    160 ml     LAB RESULTS: CBC  Recent Labs Lab 01/20/13 1409 01/21/13 0520 01/23/13 0610 01/25/13 0518 01/26/13 0525  WBC 4.6 4.7 4.0 3.2* 4.2  HGB 14.3 12.8* 13.0 12.5* 11.8*  HCT 41.6 38.5* 38.9* 38.2* 36.0*  PLT 248 230 204 214 198  MCV 71.2* 72.0* 71.2* 71.3* 72.0*  MCH 24.5* 23.9* 23.8* 23.3* 23.6*  MCHC 34.4 33.2 33.4 32.7 32.8  RDW 13.6 13.6 13.7 13.7 14.0  LYMPHSABS 1.0  --   --   --   --   MONOABS 0.1  --   --   --   --   EOSABS 0.0  --   --   --   --   BASOSABS 0.0  --   --   --   --     Chemistries   Recent Labs Lab 01/20/13 1409 01/21/13 0520 01/23/13 0610 01/26/13 0525 01/27/13 0440  NA 131* 140 142 139 138  K 5.2* 3.6 3.5 3.7 3.8  CL 98 104 108 104 103  CO2 21 24 21 22 27   GLUCOSE 254* 117* 101* 209* 194*  BUN 10 8 12 10 10   CREATININE 1.04 1.06 0.97 1.10 0.90  CALCIUM 9.7 8.7 9.1 9.2 9.1    CBG:  Recent Labs Lab 01/26/13 1210 01/26/13 1718 01/26/13 2129 01/27/13 0934 01/27/13 1158  GLUCAP 106* 154* 190* 98 179*    GFR Estimated Creatinine Clearance: 87.9 ml/min (by C-G formula based on Cr of 0.9).  Coagulation profile  Recent Labs Lab 01/27/13 0440  INR 1.04    Cardiac Enzymes  Recent Labs Lab 01/20/13 1825 01/20/13 2343 01/21/13 0520  TROPONINI <0.30 <0.30 <0.30    No components found with this basename: POCBNP,  No results found for this basename: DDIMER,  in the last 72 hours No results found for this basename: HGBA1C,  in the last 72 hours No results found for this basename: CHOL, HDL, LDLCALC, TRIG, CHOLHDL, LDLDIRECT,  in the last 72 hours No results found for this basename: TSH, T4TOTAL, FREET3,  T3FREE, THYROIDAB,  in the last 72 hours No results found for this basename: VITAMINB12, FOLATE, FERRITIN, TIBC, IRON, RETICCTPCT,  in the last 72 hours No results found for this basename: LIPASE, AMYLASE,  in the last 72 hours  Urine Studies No results found for this basename: UACOL, UAPR, USPG, UPH, UTP, UGL, UKET, UBIL, UHGB, UNIT, UROB, ULEU, UEPI, UWBC, URBC, UBAC, CAST, CRYS, UCOM, BILUA,  in the last 72 hours  MICROBIOLOGY: Recent Results (from the past 240 hour(s))  MRSA PCR SCREENING     Status: None   Collection Time    01/20/13  8:16 PM      Result  Value Range Status   MRSA by PCR NEGATIVE  NEGATIVE Final   Comment:            The GeneXpert MRSA Assay (FDA     approved for NASAL specimens     only), is one component of a     comprehensive MRSA colonization     surveillance program. It is not     intended to diagnose MRSA     infection nor to guide or     monitor treatment for     MRSA infections.  AFB CULTURE WITH SMEAR     Status: None   Collection Time    01/21/13  2:47 PM      Result Value Range Status   Specimen Description GASTRIC ASPIRATE   Final   Special Requests NONE   Final   ACID FAST SMEAR NO ACID FAST BACILLI SEEN   Final   Culture     Final   Value: CULTURE WILL BE EXAMINED FOR 6 WEEKS BEFORE ISSUING A FINAL REPORT   Report Status PENDING   Incomplete  AFB CULTURE WITH SMEAR     Status: None   Collection Time    01/23/13  5:00 AM      Result Value Range Status   Specimen Description SPUTUM   Final   Special Requests NONE   Final   ACID FAST SMEAR NO ACID FAST BACILLI SEEN   Final   Culture     Final   Value: CULTURE WILL BE EXAMINED FOR 6 WEEKS BEFORE ISSUING A FINAL REPORT   Report Status PENDING   Incomplete  AFB CULTURE, BLOOD     Status: None   Collection Time    01/23/13  3:00 PM      Result Value Range Status   Specimen Description BLOOD LEFT ARM   Final   Special Requests 4CC   Final   Culture     Final   Value: CULTURE WILL BE  EXAMINED FOR 6 WEEKS BEFORE ISSUING A FINAL REPORT   Report Status PENDING   Incomplete  AFB CULTURE WITH SMEAR     Status: None   Collection Time    01/24/13  9:06 AM      Result Value Range Status   Specimen Description SPUTUM   Final   Special Requests NONE   Final   ACID FAST SMEAR NO ACID FAST BACILLI SEEN   Final   Culture     Final   Value: CULTURE WILL BE EXAMINED FOR 6 WEEKS BEFORE ISSUING A FINAL REPORT   Report Status PENDING   Incomplete    RADIOLOGY STUDIES/RESULTS: Ct Angio Chest Pe W/cm &/or Wo Cm  01/20/2013   *RADIOLOGY REPORT*  Clinical Data:  Chest pain.  Abdominal pain.  CT ANGIOGRAPHY CHEST, ABDOMEN AND PELVIS  Technique:  Multidetector CT imaging through the chest, abdomen and pelvis was performed using the standard protocol during bolus administration of intravenous contrast.  Multiplanar reconstructed images including MIPs were obtained and reviewed to evaluate the vascular anatomy.  Contrast: OMNIPAQUE IOHEXOL 350 MG/ML SOLN  Comparison:  01/16/2013  CTA CHEST  Findings:  There is no evidence of aortic aneurysm, aortic dissection, or aortic transection.  Innominate artery, right subclavian artery, right common carotid artery, left common carotid artery, left subclavian artery, and bilateral vertebral arteries are patent.  No pericardial effusion.  No abnormal adenopathy by measurement criteria.  No obvious filling defect in the pulmonary arterial tree to suggest acute  pulmonary thromboembolism.  There is mild wall thickening of the distal esophagus just above the GE junction.  No pneumothorax.  No pleural effusion.  Pleural-based 2.2 x 1.2 cm mass at the right apex associated with surrounding micronodules.  There are other irregular, triangular, and patchy opacities within the right upper lobe. 8 mm spiculated opacity in the left upper lobe towards the apex on image 37.  Dependent atelectasis at the lung bases.  No acute bony deformity.   Review of the MIP images  confirms the above findings.  IMPRESSION: No evidence of aortic dissection.  Bilateral apical lung masses.  Primary considerations include malignancy and TB.  Small nodules and irregular opacities within the right upper lobe support the possibility of mycobacterium infection.  Correlate clinically as for the need for workup.  CTA ABDOMEN AND PELVIS  Findings:  No evidence of aortic dissection or aortic aneurysm.  Celiac, SMA, and IMA are patent.  Branch vessels are patent. Single renal arteries are patent.  Bilateral internal, external, and common iliac arteries are patent.  Multiple small foci of arterial enhancement within the liver are present.  Given history of hepatitis C, hepatic cellular carcinoma is not excluded.  Gallbladder, pancreas, spleen, adrenal glands, kidneys are stable in appearance.  Appendix is not visualized.  Bladder is within normal limits.  No free fluid.  No obvious abnormal adenopathy.  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  Bilateral L5 nerve root encroachment at this level.   Review of the MIP images confirms the above findings.  IMPRESSION: No evidence of aortic dissection or acute arterial abnormality.  Multiple foci of arterial enhancement throughout the liver.  Given history of hepatitis C, hepatic cellular carcinoma is not excluded. MRI is recommended to further characterize.  Otherwise stable exam.   Original Report Authenticated By: Jolaine Click, M.D.   US Abdomen Complete  01/16/2013   *RADIOLOGY REPORT*  Clinical Data:  Lower quadrant abdominal pain.  COMPLETE ABDOMINAL ULTRASOUND  Comparison:  CT scan 01/14/2013.  Findings:  Gallbladder:  Echogenic sludge noted layering in the gallbladder. No gallbladder wall thickening, pericholecystic fluid or definite gallstones.  Common bile duct:  Normal in caliber measuring a maximum of 4.55mm.  Liver:  There is diffuse increased echogenicity of the liver and decreased through transmission consistent with fatty  infiltration. No focal lesions or biliary dilatation.  IVC:  Normal caliber.  Pancreas:  Not well visualized due to overlying bowel gas but was normal on the recent CT scan.  Spleen:  Normal size and echogenicity without focal lesions.  Right Kidney:  10.0 cm in length. Normal renal cortical thickness and echogenicity without focal lesions or hydronephrosis.  Left Kidney:  10.8 cm in length. Normal renal cortical thickness and echogenicity without focal lesions or hydronephrosis.  Abdominal aorta:  Normal.  IMPRESSION:  1.  Layering echogenic sludge in the gallbladder but no definite gallstones or findings for acute cholecystitis. 2.  Normal caliber common bile duct. 3.  Diffuse fatty infiltration of the liver. 4.  Poor visualization of the pancreas.   Original Report Authenticated By: Rudie Meyer, M.D.   Ct Abdomen Pelvis W Contrast  01/16/2013   *RADIOLOGY REPORT*  Clinical Data: Epigastric pain.  CT ABDOMEN AND PELVIS WITH CONTRAST  Technique:  Multidetector CT imaging of the abdomen and pelvis was performed following the standard protocol during bolus administration of intravenous contrast.  Contrast: OMNIPAQUE IOHEXOL 300 MG/ML  SOLN  Comparison: 01/14/2013.  Findings: The lung bases are clear  except for dependent atelectasis.  Mild diffuse fatty infiltration of the liver but no focal hepatic lesions or intrahepatic biliary dilatation.  Gallbladder is grossly normal.  No common bile duct dilatation.  The pancreas is normal. The spleen is normal.  The adrenal glands and kidneys are normal.  The stomach, duodenum, small bowel and colon are unremarkable.  No inflammatory changes or mass lesions.  The appendix is surgically absent.  No mesenteric or retroperitoneal mass or adenopathy.  The aorta is normal in caliber.  The major branch vessels are patent.  The bladder is distended almost to the level of the umbilicus.  No pelvic mass, adenopathy or free pelvic fluid collections.  The prostate gland and  seminal vesicles are unremarkable.  No inguinal mass or hernia.  The bony pelvis is intact.  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  IMPRESSION:  1.  Distended bladder almost to the level of the umbilicus. 2.  No other significant abdominal/pelvic findings demonstrated.   Original Report Authenticated By: Rudie Meyer, M.D.   Ct Abdomen Pelvis W Contrast  01/14/2013   *RADIOLOGY REPORT*  Clinical Data: Chest pain.  Abdominal pain.  CT ABDOMEN AND PELVIS WITH CONTRAST  Technique:  Multidetector CT imaging of the abdomen and pelvis was performed following the standard protocol during bolus administration of intravenous contrast.  Contrast: OMNIPAQUE IOHEXOL 300 MG/ML  SOLN, 50mL OMNIPAQUE IOHEXOL 300 MG/ML  SOLN  Comparison: 09/19/2012  Findings: Diffuse hepatic steatosis.  Gallbladder, spleen, pancreas, adrenal glands, kidneys are within normal limits.  Bladder is distended.  Unremarkable prostate.  No free fluid.  No abnormal adenopathy.  Post appendectomy clips.  Grade 1 L5-S1 spondylolisthesis.  IMPRESSION: No acute intra-abdominal or intrapelvic pathology.  Stable.   Original Report Authenticated By: Jolaine Click, M.D.   Dg Chest Portable 1 View  01/14/2013   *RADIOLOGY REPORT*  Clinical Data: Short of breath and cough  PORTABLE CHEST - 1 VIEW  Comparison: 11/21/2010  Findings: Heart is normal in size.  Lungs are clear.  No pleural effusion.  No pneumothorax.  No acute bony deformity.  IMPRESSION: No active cardiopulmonary disease.   Original Report Authenticated By: Jolaine Click, M.D.   Dg Abd Acute W/chest  01/20/2013   *RADIOLOGY REPORT*  Clinical Data: Abdominal pain  ACUTE ABDOMEN SERIES (ABDOMEN 2 VIEW & CHEST 1 VIEW)  Comparison: CT abdomen pelvis dated 01/16/2013  Findings: Lungs are essentially clear.  No focal consolidation.  No pleural effusion or pneumothorax.  Nonobstructive bowel gas pattern.  No evidence of free air under the diaphragm on the  upright view.  Visualized osseous structures are within normal limits.  IMPRESSION: No evidence of acute cardiopulmonary disease.  No evidence of small bowel obstruction or free air.   Original Report Authenticated By: Charline Bills, M.D.   Ct Angio Abd/pel W/ And/or W/o  01/20/2013   *RADIOLOGY REPORT*  Clinical Data:  Chest pain.  Abdominal pain.  CT ANGIOGRAPHY CHEST, ABDOMEN AND PELVIS  Technique:  Multidetector CT imaging through the chest, abdomen and pelvis was performed using the standard protocol during bolus administration of intravenous contrast.  Multiplanar reconstructed images including MIPs were obtained and reviewed to evaluate the vascular anatomy.  Contrast: OMNIPAQUE IOHEXOL 350 MG/ML SOLN  Comparison:  01/16/2013  CTA CHEST  Findings:  There is no evidence of aortic aneurysm, aortic dissection, or aortic transection.  Innominate artery, right subclavian artery, right common carotid artery, left common carotid artery, left  subclavian artery, and bilateral vertebral arteries are patent.  No pericardial effusion.  No abnormal adenopathy by measurement criteria.  No obvious filling defect in the pulmonary arterial tree to suggest acute pulmonary thromboembolism.  There is mild wall thickening of the distal esophagus just above the GE junction.  No pneumothorax.  No pleural effusion.  Pleural-based 2.2 x 1.2 cm mass at the right apex associated with surrounding micronodules.  There are other irregular, triangular, and patchy opacities within the right upper lobe. 8 mm spiculated opacity in the left upper lobe towards the apex on image 37.  Dependent atelectasis at the lung bases.  No acute bony deformity.   Review of the MIP images confirms the above findings.  IMPRESSION: No evidence of aortic dissection.  Bilateral apical lung masses.  Primary considerations include malignancy and TB.  Small nodules and irregular opacities within the right upper lobe support the possibility of  mycobacterium infection.  Correlate clinically as for the need for workup.  CTA ABDOMEN AND PELVIS  Findings:  No evidence of aortic dissection or aortic aneurysm.  Celiac, SMA, and IMA are patent.  Branch vessels are patent. Single renal arteries are patent.  Bilateral internal, external, and common iliac arteries are patent.  Multiple small foci of arterial enhancement within the liver are present.  Given history of hepatitis C, hepatic cellular carcinoma is not excluded.  Gallbladder, pancreas, spleen, adrenal glands, kidneys are stable in appearance.  Appendix is not visualized.  Bladder is within normal limits.  No free fluid.  No obvious abnormal adenopathy.  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  Bilateral L5 nerve root encroachment at this level.   Review of the MIP images confirms the above findings.  IMPRESSION: No evidence of aortic dissection or acute arterial abnormality.  Multiple foci of arterial enhancement throughout the liver.  Given history of hepatitis C, hepatic cellular carcinoma is not excluded. MRI is recommended to further characterize.  Otherwise stable exam.   Original Report Authenticated By: Jolaine Click, M.D.    Clint Lipps, MD  [facility] Pager:[phone]  If 7PM-7AM, please contact night-coverage [email or web address] Password [email or web address] 01/27/2013, 12:18 PM   LOS: 7 days

## 2013-01-27 NOTE — Op Note (Signed)
Video Bronchoscopy Procedure Note  Date of Operation: 01/27/2013  Pre-op Diagnosis: B Apical Scarring, hx treated TB  Post-op Diagnosis: Same  Surgeon: Levy Pupa  Assistants: none  Anesthesia: conscious sedation, moderate sedation  Meds Given: fentanyl , versed 2mg  in divided doses, 1% lidocaine 20cc total  Operation: Flexible video fiberoptic bronchoscopy and biopsies.  Estimated Blood Loss: none  Complications: none noted  Indications and History: Adam Horton is 47 y.o. with history of TB. He was admitted with emesis and abdominal pain. His evaluation revealed bilateral apical scarring on chest imaging..  Recommendation was to perform video fiberoptic bronchoscopy with BAL for AFB. The risks, benefits, complications, treatment options and expected outcomes were discussed with the patient.  The possibilities of pneumothorax, pneumonia, reaction to medication, pulmonary aspiration, perforation of a viscus, bleeding, failure to diagnose a condition and creating a complication requiring transfusion or operation were discussed with the patient who freely signed the consent.    Description of Procedure: The patient was seen in the Preoperative Area, was examined and was deemed appropriate to proceed.  The patient was taken to [facility], identified as Adam Horton and the procedure verified as Flexible Video Fiberoptic Bronchoscopy.  A Time Out was held and the above information confirmed.   Conscious sedation was initiated as indicated above. The video fiberoptic bronchoscope was introduced via the L nare and a general inspection was performed which showed normal cords, normal trachea, normal main carina. The R sided airways were inspected and showed normal RUL, BI, RML and RLL. The L side was then inspected. The LLL, Lingular and LUL airways were normal.   BAL with 60cc NS instilled and 20cc returned was performed from the anterior segment of the RUL. This will be sent for  micro studies. There patient tolerated the procedure well. There were no obvious complications.   Samples: BAL from the anterior segment of the RUL  Plans:  We will review the microbiology results with the patient when they become available.  He remains on airborne contact isolation.    Levy Pupa, MD, PhD 01/27/2013, 7:51 AM [facility] [phone] or if no answer [phone]

## 2013-01-27 NOTE — Progress Notes (Signed)
Pt heading down for procedure. Still no IV. consent with pt. Pt alert and oriented times 4.

## 2013-01-28 DIAGNOSIS — Z8611 Personal history of tuberculosis: Secondary | ICD-10-CM

## 2013-01-28 LAB — GLUCOSE, CAPILLARY: Glucose-Capillary: 128 mg/dL — ABNORMAL HIGH (ref 70–99)

## 2013-01-28 MED ORDER — INSULIN DETEMIR 100 UNIT/ML ~~LOC~~ SOLN: 15.0000 [IU] | Freq: Every day | SUBCUTANEOUS | Status: DC

## 2013-01-28 MED ORDER — PANTOPRAZOLE SODIUM 40 MG PO TBEC: 40.0000 mg | DELAYED_RELEASE_TABLET | Freq: Every day | ORAL | Status: DC

## 2013-01-28 MED ORDER — POTASSIUM CHLORIDE CRYS ER 20 MEQ PO TBCR
40.0000 meq | EXTENDED_RELEASE_TABLET | Freq: Once | ORAL | Status: AC
  Administered 2013-01-28: 40 meq via ORAL
  Filled 2013-01-28: qty 2

## 2013-01-28 NOTE — Progress Notes (Signed)
INFECTIOUS DISEASE PROGRESS NOTE  ID: Adam Horton is a 47 y.o. male with  Principal Problem:   Abdominal pain Active Problems:   DM (diabetes mellitus)   HTN (hypertension)   Nonspecific (abnormal) findings on radiological and other examination of biliary tract   Other abnormal blood chemistry   Lesion of lung   History of tuberculosis  Subjective: Without complaints  Abtx:  Anti-infectives   None      Medications:  Scheduled: . enoxaparin (LOVENOX) injection  40 mg Subcutaneous Q24H  . hydrocortisone  25 mg Rectal BID  . insulin aspart  0-9 Units Subcutaneous TID WC  . insulin detemir  15 Units Subcutaneous QHS  . lisinopril  2.5 mg Oral Daily  . pantoprazole  40 mg Oral BID AC  . sodium chloride  3 mL Intravenous Q12H    Objective: Vital signs in last 24 hours: Temp:  [97.4 F (36.3 C)-98.6 F (37 C)] 98.6 F (37 C) (05/31 1418) Pulse Rate:  [65-83] 80 (05/31 1418) Resp:  [18-20] 18 (05/31 1418) BP: (104-115)/(72-81) 105/74 mmHg (05/31 1418) SpO2:  [95 %-100 %] 96 % (05/31 1418)   General appearance: alert, cooperative and no distress Resp: clear to auscultation bilaterally Cardio: regular rate and rhythm GI: normal findings: bowel sounds normal and soft, non-tender  Lab Results  Recent Labs  01/26/13 0525 01/27/13 0440  WBC 4.2  --   HGB 11.8*  --   HCT 36.0*  --   NA 139 138  K 3.7 3.8  CL 104 103  CO2 22 27  BUN 10 10  CREATININE 1.10 0.90   Liver Panel No results found for this basename: PROT, ALBUMIN, AST, ALT, ALKPHOS, BILITOT, BILIDIR, IBILI,  in the last 72 hours Sedimentation Rate No results found for this basename: ESRSEDRATE,  in the last 72 hours C-Reactive Protein No results found for this basename: CRP,  in the last 72 hours  Microbiology: Recent Results (from the past 240 hour(s))  MRSA PCR SCREENING     Status: None   Collection Time    01/20/13  8:16 PM      Result Value Range Status   MRSA by PCR NEGATIVE   NEGATIVE Final   Comment:            The GeneXpert MRSA Assay (FDA     approved for NASAL specimens     only), is one component of a     comprehensive MRSA colonization     surveillance program. It is not     intended to diagnose MRSA     infection nor to guide or     monitor treatment for     MRSA infections.  AFB CULTURE WITH SMEAR     Status: None   Collection Time    01/21/13  2:47 PM      Result Value Range Status   Specimen Description GASTRIC ASPIRATE   Final   Special Requests NONE   Final   ACID FAST SMEAR NO ACID FAST BACILLI SEEN   Final   Culture     Final   Value: CULTURE WILL BE EXAMINED FOR 6 WEEKS BEFORE ISSUING A FINAL REPORT   Report Status PENDING   Incomplete  AFB CULTURE WITH SMEAR     Status: None   Collection Time    01/23/13  5:00 AM      Result Value Range Status   Specimen Description SPUTUM   Final   Special Requests NONE  Final   ACID FAST SMEAR NO ACID FAST BACILLI SEEN   Final   Culture     Final   Value: CULTURE WILL BE EXAMINED FOR 6 WEEKS BEFORE ISSUING A FINAL REPORT   Report Status PENDING   Incomplete  AFB CULTURE, BLOOD     Status: None   Collection Time    01/23/13  3:00 PM      Result Value Range Status   Specimen Description BLOOD LEFT ARM   Final   Special Requests 4CC   Final   Culture     Final   Value: CULTURE WILL BE EXAMINED FOR 6 WEEKS BEFORE ISSUING A FINAL REPORT   Report Status PENDING   Incomplete  AFB CULTURE WITH SMEAR     Status: None   Collection Time    01/24/13  9:06 AM      Result Value Range Status   Specimen Description SPUTUM   Final   Special Requests NONE   Final   ACID FAST SMEAR NO ACID FAST BACILLI SEEN   Final   Culture     Final   Value: CULTURE WILL BE EXAMINED FOR 6 WEEKS BEFORE ISSUING A FINAL REPORT   Report Status PENDING   Incomplete  AFB CULTURE WITH SMEAR     Status: None   Collection Time    01/27/13  7:58 AM      Result Value Range Status   Specimen Description BRONCHIAL ALVEOLAR  LAVAGE   Final   Special Requests RUL BAL   Final   ACID FAST SMEAR NO ACID FAST BACILLI SEEN   Final   Culture     Final   Value: CULTURE WILL BE EXAMINED FOR 6 WEEKS BEFORE ISSUING A FINAL REPORT   Report Status PENDING   Incomplete  CULTURE, BAL-QUANTITATIVE     Status: None   Collection Time    01/27/13  7:58 AM      Result Value Range Status   Specimen Description BRONCHIAL ALVEOLAR LAVAGE   Final   Special Requests RUL BAL   Final   Gram Stain     Final   Value: NO WBC SEEN     NO SQUAMOUS EPITHELIAL CELLS SEEN     NO ORGANISMS SEEN   Colony Count PENDING   Incomplete   Culture Culture reincubated for better growth   Final   Report Status PENDING   Incomplete    Studies/Results: No results found.   Assessment/Plan: Cavitary Lung lesions Previously Tx TB BAL AFB (-)  Would d/c pt Have him f/u in ID clinic in 6 weeks.  available if questions  [name] Infectious Diseases [phone] [email or web address] 01/28/2013, 2:34 PM   LOS: 8 days

## 2013-01-28 NOTE — Discharge Summary (Signed)
Physician Discharge Summary  Adam Horton GNF:[id number] DOB: [birth year] DOA: 01/20/2013  PCP: No PCP Per Patient  Admit date: 01/20/2013 Discharge date: 01/28/2013  Time spent: 40 minutes  Recommendations for Outpatient Follow-up:  1. Followup with [name] for infectious disease and 6 weeks. 2. Followup with primary care physician within one week  Discharge Diagnoses:  Principal Problem:   Abdominal pain Active Problems:   DM (diabetes mellitus)   HTN (hypertension)   Nonspecific (abnormal) findings on radiological and other examination of biliary tract   Other abnormal blood chemistry   Lesion of lung   History of tuberculosis   Discharge Condition: Stable  Diet recommendation: Carbohydrate modified  Filed Weights   01/20/13 2000  Weight: 60.6 kg (133 lb 9.6 oz)    History of present illness:  Adam Horton is a 47 y.o. male with a Past Medical History of DM, HTN, ?Hep C who presents today with the above noted complaint. Please note patient is a poor historian, history was obtained via translator service on the phone. Even with translator service, history is still very poor. Patient for the past one week or so, has had persistent and constant abdominal pain and chest pain. When asked to clarify which hurts the most, he points to his epigastric area, then he will to his left pelvic area as well. He claims the pain is also in his chest in times he feels in his neck as well. He describes the pain as a combination of burning and squeezing sensation. He rates it at 10/10 in intensity. It is associated with numerous episodes of retching, and vomiting as well. The vomitus is nonbloody. He claims that food at times makes his pain worse, there is no particular relieving factors.  He was here in the emergency room for the exact same complaint on May 17 and was discharged home, he was also here on May 19 and was also discharged home. He returns to the emergency room today with  excruciating pain. During his past 2 visits, he has had 2 CT scans of his abdomen and a abdominal ultrasound that were unrevealing. He claims that he was discharged with a PPI, sucralfate, Norco, Reglan which she has been taking with no relief.  Off note, in December of 2013, he was living in [place], where he had lower abdominal pain, and needed hernia repair. He today claims that his current pain is different than his pain that he had in December 2013.  He claimed that he moved to [place] to be close to his family.  He has no history of fever or headaches. His last bowel movement was today.  Hospital Course:   1. Abdominal pain: This was the patient's initial presentation to the hospital with epigastric abdominal pain, patient was complaining about numerous episodes of retching, vomiting and indigestion. The history was very difficult to get from him even with the presence of [place] interpreter. Acute coronary syndrome was ruled out by 3 sets of cardiac enzymes and repeat of EKG which showed no evidence of ischemia. CT angio of the chest and CT angio of the abdomen were done and showed no abnormalities. Gastroenterology service was consulted EGD was done and showed acute gastritis, of the antrum. Patient treated afterwards with PPIs, he mentioned some improvement. Patient discharged a PPI, I advised him with the presence of [place] interpreter to avoid spicy food, acidic and to take his medications.  2. Bilateral apical lung lesions: At the time of admission CT angio of the  chest was done it cause of the questionable chest pain and showed scarring in the lung apices, the scarring was very typical for tuberculosis. Patient was placed on negative pressure room on airborne isolation. Infectious disease Dr. Ninetta Lights was consulted, patient has positive Quantiferon-tb gold assay. He had 3 negative conservative sputum smears for AFB, pulmonology was consulted and fiberoptic bronchoscopy was done with  bronchoalveolar lavage on 01/27/2013. The BAL AFB smear was negative. Patient was okayed for discharge by Dr. Ninetta Lights of the infection disease to followup with him in 6 weeks. Please note that patient has history of pulmonary tuberculosis which treated in from April to December of 2012 and [facility] [place]. Patient had extended course secondary to intolerance of PZA.  3. Diabetes mellitus type 2: Patient is on insulin, I spent a lot of time counseling him about his diet, patient eat nothing but carbohydrates. In his defense he saying a lot of food gives him upset stomach and carbohydrates in the only food agrees with them. Patient was insulin sliding scale the hospital, his insulin was listed he is taking 85 units of long-acting insulin, as a matter of fact in the hospital he only needed about 15 units along with his metformin. Patient should followup with primary care physician closely.  4. Hepatitis C: Follow [facility].  5. Hypertension: Resume his preadmission home medications.   Procedures:  Fiber-optic bronchoscopy with bronchoalveolar lavage  Consultations:  [name] of the  ID service.  Levy Pupa of the pulmonary service  Discharge Exam: Filed Vitals:   01/27/13 1451 01/27/13 2256 01/28/13 0513 01/28/13 1418  BP: 115/81 104/72 108/72 105/74  Pulse:  83 65 80  Temp: 97.4 F (36.3 C) 97.6 F (36.4 C) 97.9 F (36.6 C) 98.6 F (37 C)  TempSrc: Oral Oral  Oral  Resp: 20 20 18 18   Height:      Weight:      SpO2: 100% 97% 95% 96%   General: Alert and awake, oriented x3, not in any acute distress. HEENT: anicteric sclera, pupils reactive to light and accommodation, EOMI CVS: S1-S2 clear, no murmur rubs or gallops Chest: clear to auscultation bilaterally, no wheezing, rales or rhonchi Abdomen: soft nontender, nondistended, normal bowel sounds, no organomegaly Extremities: no cyanosis, clubbing or  edema noted bilaterally Neuro: Cranial nerves II-XII intact, no focal neurological deficits  Discharge Instructions  Discharge Orders   Future Orders Complete By Expires     Diet Carb Modified  As directed     Increase activity slowly  As directed         Medication List    STOP taking these medications       omeprazole 20 MG capsule  Commonly known as:  PRILOSEC      TAKE these medications       insulin detemir 100 UNIT/ML injection  Commonly known as:  LEVEMIR  Inject 0.15 mLs (15 Units total) into the skin daily.     lisinopril 5 MG tablet  Commonly known as:  PRINIVIL,ZESTRIL  Take 2.5 mg by mouth daily.     metformin 500 MG (OSM) 24 hr tablet  Commonly known as:  FORTAMET  Take 1,000 mg by mouth 2 (two) times daily with a meal.     metoCLOPramide 10 MG tablet  Commonly known as:  REGLAN  Take 1 tablet (10 mg total) by mouth every 6 (six) hours. As needed for nausea or vomiting  naproxen 500 MG tablet  Commonly known as:  NAPROSYN  Take 500 mg by mouth 2 (two) times daily as needed (pain).     OVER THE COUNTER MEDICATION  Place 1 drop into the left eye 2 (two) times daily as needed (otc eye drop prn pain).     pantoprazole 40 MG tablet  Commonly known as:  PROTONIX  Take 1 tablet (40 mg total) by mouth daily.     pyridOXINE 100 MG tablet  Commonly known as:  VITAMIN B-6  Take 100 mg by mouth daily.     sucralfate 1 G tablet  Commonly known as:  CARAFATE  Take 1 tablet (1 g total) by mouth 4 (four) times daily. 30 minutes before eating       No Known Allergies     Follow-up Information   Follow up with [name], MD In 6 weeks.   Contact information:   [address]   [address] [place] [address] [phone]       Follow up with Primary care physician In 1 week.       The results of significant diagnostics from this hospitalization (including imaging, microbiology, ancillary and laboratory) are listed below for reference.     Significant Diagnostic Studies: Ct Angio Chest Pe W/cm &/or Wo Cm  01/20/2013   *RADIOLOGY REPORT*  Clinical Data:  Chest pain.  Abdominal pain.  CT ANGIOGRAPHY CHEST, ABDOMEN AND PELVIS  Technique:  Multidetector CT imaging through the chest, abdomen and pelvis was performed using the standard protocol during bolus administration of intravenous contrast.  Multiplanar reconstructed images including MIPs were obtained and reviewed to evaluate the vascular anatomy.  Contrast: OMNIPAQUE IOHEXOL 350 MG/ML SOLN  Comparison:  01/16/2013  CTA CHEST  Findings:  There is no evidence of aortic aneurysm, aortic dissection, or aortic transection.  Innominate artery, right subclavian artery, right common carotid artery, left common carotid artery, left subclavian artery, and bilateral vertebral arteries are patent.  No pericardial effusion.  No abnormal adenopathy by measurement criteria.  No obvious filling defect in the pulmonary arterial tree to suggest acute pulmonary thromboembolism.  There is mild wall thickening of the distal esophagus just above the GE junction.  No pneumothorax.  No pleural effusion.  Pleural-based 2.2 x 1.2 cm mass at the right apex associated with surrounding micronodules.  There are other irregular, triangular, and patchy opacities within the right upper lobe. 8 mm spiculated opacity in the left upper lobe towards the apex on image 37.  Dependent atelectasis at the lung bases.  No acute bony deformity.   Review of the MIP images confirms the above findings.  IMPRESSION: No evidence of aortic dissection.  Bilateral apical lung masses.  Primary considerations include malignancy and TB.  Small nodules and irregular opacities within the right upper lobe support the possibility of mycobacterium infection.  Correlate clinically as for the need for workup.  CTA ABDOMEN AND PELVIS  Findings:  No evidence of aortic dissection or aortic aneurysm.  Celiac, SMA, and IMA are patent.  Branch vessels  are patent. Single renal arteries are patent.  Bilateral internal, external, and common iliac arteries are patent.  Multiple small foci of arterial enhancement within the liver are present.  Given history of hepatitis C, hepatic cellular carcinoma is not excluded.  Gallbladder, pancreas, spleen, adrenal glands, kidneys are stable in appearance.  Appendix is not visualized.  Bladder is within normal limits.  No free fluid.  No obvious abnormal adenopathy.  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  Bilateral L5 nerve root encroachment at this level.   Review of the MIP images confirms the above findings.  IMPRESSION: No evidence of aortic dissection or acute arterial abnormality.  Multiple foci of arterial enhancement throughout the liver.  Given history of hepatitis C, hepatic cellular carcinoma is not excluded. MRI is recommended to further characterize.  Otherwise stable exam.   Original Report Authenticated By: Jolaine Click, M.D.   US Abdomen Complete  01/16/2013   *RADIOLOGY REPORT*  Clinical Data:  Lower quadrant abdominal pain.  COMPLETE ABDOMINAL ULTRASOUND  Comparison:  CT scan 01/14/2013.  Findings:  Gallbladder:  Echogenic sludge noted layering in the gallbladder. No gallbladder wall thickening, pericholecystic fluid or definite gallstones.  Common bile duct:  Normal in caliber measuring a maximum of 4.23mm.  Liver:  There is diffuse increased echogenicity of the liver and decreased through transmission consistent with fatty infiltration. No focal lesions or biliary dilatation.  IVC:  Normal caliber.  Pancreas:  Not well visualized due to overlying bowel gas but was normal on the recent CT scan.  Spleen:  Normal size and echogenicity without focal lesions.  Right Kidney:  10.0 cm in length. Normal renal cortical thickness and echogenicity without focal lesions or hydronephrosis.  Left Kidney:  10.8 cm in length. Normal renal cortical thickness and echogenicity without focal lesions or  hydronephrosis.  Abdominal aorta:  Normal.  IMPRESSION:  1.  Layering echogenic sludge in the gallbladder but no definite gallstones or findings for acute cholecystitis. 2.  Normal caliber common bile duct. 3.  Diffuse fatty infiltration of the liver. 4.  Poor visualization of the pancreas.   Original Report Authenticated By: Rudie Meyer, M.D.   Ct Abdomen Pelvis W Contrast  01/16/2013   *RADIOLOGY REPORT*  Clinical Data: Epigastric pain.  CT ABDOMEN AND PELVIS WITH CONTRAST  Technique:  Multidetector CT imaging of the abdomen and pelvis was performed following the standard protocol during bolus administration of intravenous contrast.  Contrast: OMNIPAQUE IOHEXOL 300 MG/ML  SOLN  Comparison: 01/14/2013.  Findings: The lung bases are clear except for dependent atelectasis.  Mild diffuse fatty infiltration of the liver but no focal hepatic lesions or intrahepatic biliary dilatation.  Gallbladder is grossly normal.  No common bile duct dilatation.  The pancreas is normal. The spleen is normal.  The adrenal glands and kidneys are normal.  The stomach, duodenum, small bowel and colon are unremarkable.  No inflammatory changes or mass lesions.  The appendix is surgically absent.  No mesenteric or retroperitoneal mass or adenopathy.  The aorta is normal in caliber.  The major branch vessels are patent.  The bladder is distended almost to the level of the umbilicus.  No pelvic mass, adenopathy or free pelvic fluid collections.  The prostate gland and seminal vesicles are unremarkable.  No inguinal mass or hernia.  The bony pelvis is intact.  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  IMPRESSION:  1.  Distended bladder almost to the level of the umbilicus. 2.  No other significant abdominal/pelvic findings demonstrated.   Original Report Authenticated By: Rudie Meyer, M.D.   Ct Abdomen Pelvis W Contrast  01/14/2013   *RADIOLOGY REPORT*  Clinical Data: Chest pain.   Abdominal pain.  CT ABDOMEN AND PELVIS WITH CONTRAST  Technique:  Multidetector CT imaging of the abdomen and pelvis was performed following the standard protocol during bolus administration of intravenous contrast.  Contrast: OMNIPAQUE IOHEXOL 300 MG/ML  SOLN, 50mL OMNIPAQUE IOHEXOL 300 MG/ML  SOLN  Comparison: 09/19/2012  Findings: Diffuse hepatic steatosis.  Gallbladder, spleen, pancreas, adrenal glands, kidneys are within normal limits.  Bladder is distended.  Unremarkable prostate.  No free fluid.  No abnormal adenopathy.  Post appendectomy clips.  Grade 1 L5-S1 spondylolisthesis.  IMPRESSION: No acute intra-abdominal or intrapelvic pathology.  Stable.   Original Report Authenticated By: Jolaine Click, M.D.   Dg Chest Portable 1 View  01/14/2013   *RADIOLOGY REPORT*  Clinical Data: Short of breath and cough  PORTABLE CHEST - 1 VIEW  Comparison: 11/21/2010  Findings: Heart is normal in size.  Lungs are clear.  No pleural effusion.  No pneumothorax.  No acute bony deformity.  IMPRESSION: No active cardiopulmonary disease.   Original Report Authenticated By: Jolaine Click, M.D.   Dg Abd Acute W/chest  01/20/2013   *RADIOLOGY REPORT*  Clinical Data: Abdominal pain  ACUTE ABDOMEN SERIES (ABDOMEN 2 VIEW & CHEST 1 VIEW)  Comparison: CT abdomen pelvis dated 01/16/2013  Findings: Lungs are essentially clear.  No focal consolidation.  No pleural effusion or pneumothorax.  Nonobstructive bowel gas pattern.  No evidence of free air under the diaphragm on the upright view.  Visualized osseous structures are within normal limits.  IMPRESSION: No evidence of acute cardiopulmonary disease.  No evidence of small bowel obstruction or free air.   Original Report Authenticated By: Charline Bills, M.D.   Ct Angio Abd/pel W/ And/or W/o  01/20/2013   *RADIOLOGY REPORT*  Clinical Data:  Chest pain.  Abdominal pain.  CT ANGIOGRAPHY CHEST, ABDOMEN AND PELVIS  Technique:  Multidetector CT imaging through the chest, abdomen  and pelvis was performed using the standard protocol during bolus administration of intravenous contrast.  Multiplanar reconstructed images including MIPs were obtained and reviewed to evaluate the vascular anatomy.  Contrast: OMNIPAQUE IOHEXOL 350 MG/ML SOLN  Comparison:  01/16/2013  CTA CHEST  Findings:  There is no evidence of aortic aneurysm, aortic dissection, or aortic transection.  Innominate artery, right subclavian artery, right common carotid artery, left common carotid artery, left subclavian artery, and bilateral vertebral arteries are patent.  No pericardial effusion.  No abnormal adenopathy by measurement criteria.  No obvious filling defect in the pulmonary arterial tree to suggest acute pulmonary thromboembolism.  There is mild wall thickening of the distal esophagus just above the GE junction.  No pneumothorax.  No pleural effusion.  Pleural-based 2.2 x 1.2 cm mass at the right apex associated with surrounding micronodules.  There are other irregular, triangular, and patchy opacities within the right upper lobe. 8 mm spiculated opacity in the left upper lobe towards the apex on image 37.  Dependent atelectasis at the lung bases.  No acute bony deformity.   Review of the MIP images confirms the above findings.  IMPRESSION: No evidence of aortic dissection.  Bilateral apical lung masses.  Primary considerations include malignancy and TB.  Small nodules and irregular opacities within the right upper lobe support the possibility of mycobacterium infection.  Correlate clinically as for the need for workup.  CTA ABDOMEN AND PELVIS  Findings:  No evidence of aortic dissection or aortic aneurysm.  Celiac, SMA, and IMA are patent.  Branch vessels are patent. Single renal arteries are patent.  Bilateral internal, external, and common iliac arteries are patent.  Multiple small foci of arterial enhancement within the liver are present.  Given history of hepatitis C, hepatic cellular carcinoma is not  excluded.  Gallbladder, pancreas, spleen, adrenal glands, kidneys are stable in appearance.  Appendix is not visualized.  Bladder is within normal limits.  No free fluid.  No obvious abnormal adenopathy.  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  Bilateral L5 nerve root encroachment at this level.   Review of the MIP images confirms the above findings.  IMPRESSION: No evidence of aortic dissection or acute arterial abnormality.  Multiple foci of arterial enhancement throughout the liver.  Given history of hepatitis C, hepatic cellular carcinoma is not excluded. MRI is recommended to further characterize.  Otherwise stable exam.   Original Report Authenticated By: Jolaine Click, M.D.    Microbiology: Recent Results (from the past 240 hour(s))  MRSA PCR SCREENING     Status: None   Collection Time    01/20/13  8:16 PM      Result Value Range Status   MRSA by PCR NEGATIVE  NEGATIVE Final   Comment:            The GeneXpert MRSA Assay (FDA     approved for NASAL specimens     only), is one component of a     comprehensive MRSA colonization     surveillance program. It is not     intended to diagnose MRSA     infection nor to guide or     monitor treatment for     MRSA infections.  AFB CULTURE WITH SMEAR     Status: None   Collection Time    01/21/13  2:47 PM      Result Value Range Status   Specimen Description GASTRIC ASPIRATE   Final   Special Requests NONE   Final   ACID FAST SMEAR NO ACID FAST BACILLI SEEN   Final   Culture     Final   Value: CULTURE WILL BE EXAMINED FOR 6 WEEKS BEFORE ISSUING A FINAL REPORT   Report Status PENDING   Incomplete  AFB CULTURE WITH SMEAR     Status: None   Collection Time    01/23/13  5:00 AM      Result Value Range Status   Specimen Description SPUTUM   Final   Special Requests NONE   Final   ACID FAST SMEAR NO ACID FAST BACILLI SEEN   Final   Culture     Final   Value: CULTURE WILL BE EXAMINED FOR 6 WEEKS BEFORE ISSUING A FINAL REPORT    Report Status PENDING   Incomplete  AFB CULTURE, BLOOD     Status: None   Collection Time    01/23/13  3:00 PM      Result Value Range Status   Specimen Description BLOOD LEFT ARM   Final   Special Requests 4CC   Final   Culture     Final   Value: CULTURE WILL BE EXAMINED FOR 6 WEEKS BEFORE ISSUING A FINAL REPORT   Report Status PENDING   Incomplete  AFB CULTURE WITH SMEAR     Status: None   Collection Time    01/24/13  9:06 AM      Result Value Range Status   Specimen Description SPUTUM   Final   Special Requests NONE   Final   ACID FAST SMEAR NO ACID FAST BACILLI SEEN   Final   Culture     Final   Value: CULTURE WILL BE EXAMINED FOR 6 WEEKS BEFORE ISSUING A FINAL REPORT   Report Status PENDING   Incomplete  AFB  CULTURE WITH SMEAR     Status: None   Collection Time    01/27/13  7:58 AM      Result Value Range Status   Specimen Description BRONCHIAL ALVEOLAR LAVAGE   Final   Special Requests RUL BAL   Final   ACID FAST SMEAR NO ACID FAST BACILLI SEEN   Final   Culture     Final   Value: CULTURE WILL BE EXAMINED FOR 6 WEEKS BEFORE ISSUING A FINAL REPORT   Report Status PENDING   Incomplete  FUNGUS CULTURE W SMEAR     Status: None   Collection Time    01/27/13  7:58 AM      Result Value Range Status   Specimen Description BRONCHIAL ALVEOLAR LAVAGE   Final   Special Requests RUL BAL   Final   Fungal Smear NO YEAST OR FUNGAL ELEMENTS SEEN   Final   Culture CULTURE IN PROGRESS FOR FOUR WEEKS   Final   Report Status PENDING   Incomplete  CULTURE, BAL-QUANTITATIVE     Status: None   Collection Time    01/27/13  7:58 AM      Result Value Range Status   Specimen Description BRONCHIAL ALVEOLAR LAVAGE   Final   Special Requests RUL BAL   Final   Gram Stain     Final   Value: NO WBC SEEN     NO SQUAMOUS EPITHELIAL CELLS SEEN     NO ORGANISMS SEEN   Colony Count PENDING   Incomplete   Culture Culture reincubated for better growth   Final   Report Status PENDING   Incomplete      Labs: Basic Metabolic Panel:  Recent Labs Lab 01/23/13 0610 01/26/13 0525 01/27/13 0440  NA 142 139 138  K 3.5 3.7 3.8  CL 108 104 103  CO2 21 22 27   GLUCOSE 101* 209* 194*  BUN 12 10 10   CREATININE 0.97 1.10 0.90  CALCIUM 9.1 9.2 9.1   Liver Function Tests:  Recent Labs Lab 01/23/13 0610  AST 49*  ALT 74*  ALKPHOS 67  BILITOT 0.5  PROT 6.9  ALBUMIN 3.5   No results found for this basename: LIPASE, AMYLASE,  in the last 168 hours No results found for this basename: AMMONIA,  in the last 168 hours CBC:  Recent Labs Lab 01/23/13 0610 01/25/13 0518 01/26/13 0525  WBC 4.0 3.2* 4.2  HGB 13.0 12.5* 11.8*  HCT 38.9* 38.2* 36.0*  MCV 71.2* 71.3* 72.0*  PLT 204 214 198   Cardiac Enzymes: No results found for this basename: CKTOTAL, CKMB, CKMBINDEX, TROPONINI,  in the last 168 hours BNP: BNP (last 3 results) No results found for this basename: PROBNP,  in the last 8760 hours CBG:  Recent Labs Lab 01/27/13 1623 01/27/13 2250 01/28/13 0804 01/28/13 1150 01/28/13 1718  GLUCAP 197* 176* 128* 223* 189*       Signed:  Sahily Biddle A  [facility] 01/28/2013, 6:51 PM

## 2013-01-28 NOTE — Progress Notes (Signed)
PATIENT DETAILS Name: Adam Horton Age: 47 y.o. Sex: male Date of Birth: Jul 12, 1966 Admit Date: 01/20/2013 Admitting Physician Dewayne Shorter Levora Dredge, MD PCP:No PCP Per Patient  Brief summary Patient is a 47 year old [place] male with a past medical history of hypertension, diabetes,? Hepatitis C who presented to the emergency room on 5/23 with diffuse abdominal and chest pain for a week. This was his third emergency room visit. He already had 2 CT scans of his abdomen prior to this visit. Given ongoing chest and abdominal pain, patient was admitted to the step down unit, he had elevated lactic acid levels on admission therefore a CT angiogram of the chest and abdomen was pursued which showed a normal vascular anatomy, however the CTA of the chest showed bilateral pulmonary apical lesions consistent with perhaps tuberculosis. GI was consulted, EGD was positive for gastritis however it was felt that this was not responsible for his severe pain. His pain has improved with supportive care including PPI, he is currently on airborne isolation and we are awaiting sputum AFB.Infectious disease has been consulted.  Subjective: Seen after his bronchoscopy, feels okay. York Spaniel he got treated for TB in 2011 in [place] [facility])  Assessment/Plan: Principal Problem:  B/L upper lobe lesion in lung -?TB, positive Quantiferon-tb gold assay but negative sputum for AFB -Gastric aspirate neg for AFB,Quantiferon-tb gold assay can be positive or there is any previous exposure to MTB -HIV negative, he was treated for tuberculosis (Not clear if it was active or LTBI) in 2011 in [place], [place]. -Appreciate Dr. Ninetta Lights and Dr. Delton Coombes input. -FOB done with BAL specimens sent to the lab for AFB culture with smear, fungal and bacterial culture. -Per ID if AFB smear is negative and be discharged and follow with ID clinic.  Abdominal pain -Etiology remains unclear-but clearly better and continues to improve daily.   -Did have mild lipase elevation on admission, however suspect pancreas not to be the etiology  -EGD 5/24-showed gastritis-do not think this still explains his abdominal pain -CT Angio Chest/Abdomen-no acute abnormalities to explain this amount of pain. -check ESR only 8, so doubt a chronic inflammatory condition -??anxiety was playing a role -Minimize Narcotics  Intermittent minimal rectal bleeding - Suspected hemorrhoidal etiology- on Anusol - Check CBC in a.m.  Hep C -Confirmed by the qualitative HCV PCR  -LFT's stable  DM (diabetes mellitus) -CBG's stable -c/w SSI -resumed Levemir at reduced dose-will increase to 15 units  HTN (hypertension) -resume Lisinopril  Disposition: Remain inpatient, continue airborne isolation  DVT Prophylaxis: Start prophylactic Lovenox  Code Status: Full code   Family Communication None at bedside  Procedures:  None  CONSULTS:  GI   MEDICATIONS: Scheduled Meds: . enoxaparin (LOVENOX) injection  40 mg Subcutaneous Q24H  . hydrocortisone  25 mg Rectal BID  . insulin aspart  0-9 Units Subcutaneous TID WC  . insulin detemir  15 Units Subcutaneous QHS  . lisinopril  2.5 mg Oral Daily  . pantoprazole  40 mg Oral BID AC  . sodium chloride  3 mL Intravenous Q12H   Continuous Infusions:   PRN Meds:.acetaminophen, acetaminophen, albuterol, diphenhydrAMINE, gi cocktail, ondansetron (ZOFRAN) IV, ondansetron, oxyCODONE  Antibiotics: Anti-infectives   None       PHYSICAL EXAM: Vital signs in last 24 hours: Filed Vitals:   01/27/13 0930 01/27/13 1451 01/27/13 2256 01/28/13 0513  BP: 115/80 115/81 104/72 108/72  Pulse:   83 65  Temp: 98.2 F (36.8 C) 97.4 F (36.3 C) 97.6 F (36.4 C) 97.9 F (  36.6 C)  TempSrc: Oral Oral Oral   Resp: 21 20 20 18   Height:      Weight:      SpO2: 99% 100% 97% 95%    Weight change:  Filed Weights   01/20/13 2000  Weight: 60.6 kg (133 lb 9.6 oz)   Body mass index is 21.57 kg/(m^2).    Gen Exam: Awake and alert with clear speech.   Neck: Supple, No JVD.   Chest: B/L Clear.   CVS: S1 S2 Regular, no murmurs.  Abdomen: soft, BS +, mildly tender in epigastric, non distended. No rebound Extremities: no edema, lower extremities warm to touch Neurologic: Non Focal.   Skin: No Rash.   Wounds: N/A.    Intake/Output from previous day:  Intake/Output Summary (Last 24 hours) at 01/28/13 1047 Last data filed at 01/27/13 1452  Gross per 24 hour  Intake     60 ml  Output      0 ml  Net     60 ml     LAB RESULTS: CBC  Recent Labs Lab 01/23/13 0610 01/25/13 0518 01/26/13 0525  WBC 4.0 3.2* 4.2  HGB 13.0 12.5* 11.8*  HCT 38.9* 38.2* 36.0*  PLT 204 214 198  MCV 71.2* 71.3* 72.0*  MCH 23.8* 23.3* 23.6*  MCHC 33.4 32.7 32.8  RDW 13.7 13.7 14.0    Chemistries   Recent Labs Lab 01/23/13 0610 01/26/13 0525 01/27/13 0440  NA 142 139 138  K 3.5 3.7 3.8  CL 108 104 103  CO2 21 22 27   GLUCOSE 101* 209* 194*  BUN 12 10 10   CREATININE 0.97 1.10 0.90  CALCIUM 9.1 9.2 9.1    CBG:  Recent Labs Lab 01/27/13 0934 01/27/13 1158 01/27/13 1623 01/27/13 2250 01/28/13 0804  GLUCAP 98 179* 197* 176* 128*    GFR Estimated Creatinine Clearance: 87.9 ml/min (by C-G formula based on Cr of 0.9).  Coagulation profile  Recent Labs Lab 01/27/13 0440  INR 1.04    Cardiac Enzymes No results found for this basename: CK, CKMB, TROPONINI, MYOGLOBIN,  in the last 168 hours  No components found with this basename: POCBNP,  No results found for this basename: DDIMER,  in the last 72 hours No results found for this basename: HGBA1C,  in the last 72 hours No results found for this basename: CHOL, HDL, LDLCALC, TRIG, CHOLHDL, LDLDIRECT,  in the last 72 hours No results found for this basename: TSH, T4TOTAL, FREET3, T3FREE, THYROIDAB,  in the last 72 hours No results found for this basename: VITAMINB12, FOLATE, FERRITIN, TIBC, IRON, RETICCTPCT,  in the last 72  hours No results found for this basename: LIPASE, AMYLASE,  in the last 72 hours  Urine Studies No results found for this basename: UACOL, UAPR, USPG, UPH, UTP, UGL, UKET, UBIL, UHGB, UNIT, UROB, ULEU, UEPI, UWBC, URBC, UBAC, CAST, CRYS, UCOM, BILUA,  in the last 72 hours  MICROBIOLOGY: Recent Results (from the past 240 hour(s))  MRSA PCR SCREENING     Status: None   Collection Time    01/20/13  8:16 PM      Result Value Range Status   MRSA by PCR NEGATIVE  NEGATIVE Final   Comment:            The GeneXpert MRSA Assay (FDA     approved for NASAL specimens     only), is one component of a     comprehensive MRSA colonization     surveillance program. It is  not     intended to diagnose MRSA     infection nor to guide or     monitor treatment for     MRSA infections.  AFB CULTURE WITH SMEAR     Status: None   Collection Time    01/21/13  2:47 PM      Result Value Range Status   Specimen Description GASTRIC ASPIRATE   Final   Special Requests NONE   Final   ACID FAST SMEAR NO ACID FAST BACILLI SEEN   Final   Culture     Final   Value: CULTURE WILL BE EXAMINED FOR 6 WEEKS BEFORE ISSUING A FINAL REPORT   Report Status PENDING   Incomplete  AFB CULTURE WITH SMEAR     Status: None   Collection Time    01/23/13  5:00 AM      Result Value Range Status   Specimen Description SPUTUM   Final   Special Requests NONE   Final   ACID FAST SMEAR NO ACID FAST BACILLI SEEN   Final   Culture     Final   Value: CULTURE WILL BE EXAMINED FOR 6 WEEKS BEFORE ISSUING A FINAL REPORT   Report Status PENDING   Incomplete  AFB CULTURE, BLOOD     Status: None   Collection Time    01/23/13  3:00 PM      Result Value Range Status   Specimen Description BLOOD LEFT ARM   Final   Special Requests 4CC   Final   Culture     Final   Value: CULTURE WILL BE EXAMINED FOR 6 WEEKS BEFORE ISSUING A FINAL REPORT   Report Status PENDING   Incomplete  AFB CULTURE WITH SMEAR     Status: None   Collection Time     01/24/13  9:06 AM      Result Value Range Status   Specimen Description SPUTUM   Final   Special Requests NONE   Final   ACID FAST SMEAR NO ACID FAST BACILLI SEEN   Final   Culture     Final   Value: CULTURE WILL BE EXAMINED FOR 6 WEEKS BEFORE ISSUING A FINAL REPORT   Report Status PENDING   Incomplete  CULTURE, BAL-QUANTITATIVE     Status: None   Collection Time    01/27/13  7:58 AM      Result Value Range Status   Specimen Description BRONCHIAL ALVEOLAR LAVAGE   Final   Special Requests RUL BAL   Final   Gram Stain     Final   Value: NO WBC SEEN     NO SQUAMOUS EPITHELIAL CELLS SEEN     NO ORGANISMS SEEN   Colony Count PENDING   Incomplete   Culture PENDING   Incomplete   Report Status PENDING   Incomplete    RADIOLOGY STUDIES/RESULTS: Ct Angio Chest Pe W/cm &/or Wo Cm  01/20/2013   *RADIOLOGY REPORT*  Clinical Data:  Chest pain.  Abdominal pain.  CT ANGIOGRAPHY CHEST, ABDOMEN AND PELVIS  Technique:  Multidetector CT imaging through the chest, abdomen and pelvis was performed using the standard protocol during bolus administration of intravenous contrast.  Multiplanar reconstructed images including MIPs were obtained and reviewed to evaluate the vascular anatomy.  Contrast: OMNIPAQUE IOHEXOL 350 MG/ML SOLN  Comparison:  01/16/2013  CTA CHEST  Findings:  There is no evidence of aortic aneurysm, aortic dissection, or aortic transection.  Innominate artery, right subclavian artery, right common carotid  artery, left common carotid artery, left subclavian artery, and bilateral vertebral arteries are patent.  No pericardial effusion.  No abnormal adenopathy by measurement criteria.  No obvious filling defect in the pulmonary arterial tree to suggest acute pulmonary thromboembolism.  There is mild wall thickening of the distal esophagus just above the GE junction.  No pneumothorax.  No pleural effusion.  Pleural-based 2.2 x 1.2 cm mass at the right apex associated with surrounding  micronodules.  There are other irregular, triangular, and patchy opacities within the right upper lobe. 8 mm spiculated opacity in the left upper lobe towards the apex on image 37.  Dependent atelectasis at the lung bases.  No acute bony deformity.   Review of the MIP images confirms the above findings.  IMPRESSION: No evidence of aortic dissection.  Bilateral apical lung masses.  Primary considerations include malignancy and TB.  Small nodules and irregular opacities within the right upper lobe support the possibility of mycobacterium infection.  Correlate clinically as for the need for workup.  CTA ABDOMEN AND PELVIS  Findings:  No evidence of aortic dissection or aortic aneurysm.  Celiac, SMA, and IMA are patent.  Branch vessels are patent. Single renal arteries are patent.  Bilateral internal, external, and common iliac arteries are patent.  Multiple small foci of arterial enhancement within the liver are present.  Given history of hepatitis C, hepatic cellular carcinoma is not excluded.  Gallbladder, pancreas, spleen, adrenal glands, kidneys are stable in appearance.  Appendix is not visualized.  Bladder is within normal limits.  No free fluid.  No obvious abnormal adenopathy.  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  Bilateral L5 nerve root encroachment at this level.   Review of the MIP images confirms the above findings.  IMPRESSION: No evidence of aortic dissection or acute arterial abnormality.  Multiple foci of arterial enhancement throughout the liver.  Given history of hepatitis C, hepatic cellular carcinoma is not excluded. MRI is recommended to further characterize.  Otherwise stable exam.   Original Report Authenticated By: Jolaine Click, M.D.   US Abdomen Complete  01/16/2013   *RADIOLOGY REPORT*  Clinical Data:  Lower quadrant abdominal pain.  COMPLETE ABDOMINAL ULTRASOUND  Comparison:  CT scan 01/14/2013.  Findings:  Gallbladder:  Echogenic sludge noted layering in the gallbladder.  No gallbladder wall thickening, pericholecystic fluid or definite gallstones.  Common bile duct:  Normal in caliber measuring a maximum of 4.52mm.  Liver:  There is diffuse increased echogenicity of the liver and decreased through transmission consistent with fatty infiltration. No focal lesions or biliary dilatation.  IVC:  Normal caliber.  Pancreas:  Not well visualized due to overlying bowel gas but was normal on the recent CT scan.  Spleen:  Normal size and echogenicity without focal lesions.  Right Kidney:  10.0 cm in length. Normal renal cortical thickness and echogenicity without focal lesions or hydronephrosis.  Left Kidney:  10.8 cm in length. Normal renal cortical thickness and echogenicity without focal lesions or hydronephrosis.  Abdominal aorta:  Normal.  IMPRESSION:  1.  Layering echogenic sludge in the gallbladder but no definite gallstones or findings for acute cholecystitis. 2.  Normal caliber common bile duct. 3.  Diffuse fatty infiltration of the liver. 4.  Poor visualization of the pancreas.   Original Report Authenticated By: Rudie Meyer, M.D.   Ct Abdomen Pelvis W Contrast  01/16/2013   *RADIOLOGY REPORT*  Clinical Data: Epigastric pain.  CT ABDOMEN AND PELVIS WITH CONTRAST  Technique:  Multidetector  CT imaging of the abdomen and pelvis was performed following the standard protocol during bolus administration of intravenous contrast.  Contrast: OMNIPAQUE IOHEXOL 300 MG/ML  SOLN  Comparison: 01/14/2013.  Findings: The lung bases are clear except for dependent atelectasis.  Mild diffuse fatty infiltration of the liver but no focal hepatic lesions or intrahepatic biliary dilatation.  Gallbladder is grossly normal.  No common bile duct dilatation.  The pancreas is normal. The spleen is normal.  The adrenal glands and kidneys are normal.  The stomach, duodenum, small bowel and colon are unremarkable.  No inflammatory changes or mass lesions.  The appendix is surgically absent.  No  mesenteric or retroperitoneal mass or adenopathy.  The aorta is normal in caliber.  The major branch vessels are patent.  The bladder is distended almost to the level of the umbilicus.  No pelvic mass, adenopathy or free pelvic fluid collections.  The prostate gland and seminal vesicles are unremarkable.  No inguinal mass or hernia.  The bony pelvis is intact.  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  IMPRESSION:  1.  Distended bladder almost to the level of the umbilicus. 2.  No other significant abdominal/pelvic findings demonstrated.   Original Report Authenticated By: Rudie Meyer, M.D.   Ct Abdomen Pelvis W Contrast  01/14/2013   *RADIOLOGY REPORT*  Clinical Data: Chest pain.  Abdominal pain.  CT ABDOMEN AND PELVIS WITH CONTRAST  Technique:  Multidetector CT imaging of the abdomen and pelvis was performed following the standard protocol during bolus administration of intravenous contrast.  Contrast: OMNIPAQUE IOHEXOL 300 MG/ML  SOLN, 50mL OMNIPAQUE IOHEXOL 300 MG/ML  SOLN  Comparison: 09/19/2012  Findings: Diffuse hepatic steatosis.  Gallbladder, spleen, pancreas, adrenal glands, kidneys are within normal limits.  Bladder is distended.  Unremarkable prostate.  No free fluid.  No abnormal adenopathy.  Post appendectomy clips.  Grade 1 L5-S1 spondylolisthesis.  IMPRESSION: No acute intra-abdominal or intrapelvic pathology.  Stable.   Original Report Authenticated By: Jolaine Click, M.D.   Dg Chest Portable 1 View  01/14/2013   *RADIOLOGY REPORT*  Clinical Data: Short of breath and cough  PORTABLE CHEST - 1 VIEW  Comparison: 11/21/2010  Findings: Heart is normal in size.  Lungs are clear.  No pleural effusion.  No pneumothorax.  No acute bony deformity.  IMPRESSION: No active cardiopulmonary disease.   Original Report Authenticated By: Jolaine Click, M.D.   Dg Abd Acute W/chest  01/20/2013   *RADIOLOGY REPORT*  Clinical Data: Abdominal pain  ACUTE ABDOMEN SERIES  (ABDOMEN 2 VIEW & CHEST 1 VIEW)  Comparison: CT abdomen pelvis dated 01/16/2013  Findings: Lungs are essentially clear.  No focal consolidation.  No pleural effusion or pneumothorax.  Nonobstructive bowel gas pattern.  No evidence of free air under the diaphragm on the upright view.  Visualized osseous structures are within normal limits.  IMPRESSION: No evidence of acute cardiopulmonary disease.  No evidence of small bowel obstruction or free air.   Original Report Authenticated By: Charline Bills, M.D.   Ct Angio Abd/pel W/ And/or W/o  01/20/2013   *RADIOLOGY REPORT*  Clinical Data:  Chest pain.  Abdominal pain.  CT ANGIOGRAPHY CHEST, ABDOMEN AND PELVIS  Technique:  Multidetector CT imaging through the chest, abdomen and pelvis was performed using the standard protocol during bolus administration of intravenous contrast.  Multiplanar reconstructed images including MIPs were obtained and reviewed to evaluate the vascular anatomy.  Contrast: OMNIPAQUE IOHEXOL 350 MG/ML  SOLN  Comparison:  01/16/2013  CTA CHEST  Findings:  There is no evidence of aortic aneurysm, aortic dissection, or aortic transection.  Innominate artery, right subclavian artery, right common carotid artery, left common carotid artery, left subclavian artery, and bilateral vertebral arteries are patent.  No pericardial effusion.  No abnormal adenopathy by measurement criteria.  No obvious filling defect in the pulmonary arterial tree to suggest acute pulmonary thromboembolism.  There is mild wall thickening of the distal esophagus just above the GE junction.  No pneumothorax.  No pleural effusion.  Pleural-based 2.2 x 1.2 cm mass at the right apex associated with surrounding micronodules.  There are other irregular, triangular, and patchy opacities within the right upper lobe. 8 mm spiculated opacity in the left upper lobe towards the apex on image 37.  Dependent atelectasis at the lung bases.  No acute bony deformity.   Review of the MIP  images confirms the above findings.  IMPRESSION: No evidence of aortic dissection.  Bilateral apical lung masses.  Primary considerations include malignancy and TB.  Small nodules and irregular opacities within the right upper lobe support the possibility of mycobacterium infection.  Correlate clinically as for the need for workup.  CTA ABDOMEN AND PELVIS  Findings:  No evidence of aortic dissection or aortic aneurysm.  Celiac, SMA, and IMA are patent.  Branch vessels are patent. Single renal arteries are patent.  Bilateral internal, external, and common iliac arteries are patent.  Multiple small foci of arterial enhancement within the liver are present.  Given history of hepatitis C, hepatic cellular carcinoma is not excluded.  Gallbladder, pancreas, spleen, adrenal glands, kidneys are stable in appearance.  Appendix is not visualized.  Bladder is within normal limits.  No free fluid.  No obvious abnormal adenopathy.  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  Bilateral L5 nerve root encroachment at this level.   Review of the MIP images confirms the above findings.  IMPRESSION: No evidence of aortic dissection or acute arterial abnormality.  Multiple foci of arterial enhancement throughout the liver.  Given history of hepatitis C, hepatic cellular carcinoma is not excluded. MRI is recommended to further characterize.  Otherwise stable exam.   Original Report Authenticated By: Jolaine Click, M.D.    Clint Lipps, MD  [facility] Pager:[phone]  If 7PM-7AM, please contact night-coverage [email or web address] Password [email or web address] 01/28/2013, 10:47 AM   LOS: 8 days

## 2013-01-28 NOTE — Progress Notes (Signed)
01/28/13 Patient to be discharged today. Discharge instructions to be reviewed with nephew. IV site removed.

## 2013-02-24 LAB — FUNGUS CULTURE W SMEAR

## 2013-03-07 LAB — AFB CULTURE WITH SMEAR (NOT AT ARMC)

## 2013-03-07 LAB — AFB CULTURE, BLOOD

## 2013-03-13 ENCOUNTER — Other Ambulatory Visit: Payer: Self-pay

## 2013-03-13 ENCOUNTER — Ambulatory Visit (HOSPITAL_COMMUNITY)
Admission: RE | Admit: 2013-03-13 | Discharge: 2013-03-13 | Disposition: A | Discharge status: Home / Self Care | Payer: BC Managed Care – PPO | Source: Ambulatory Visit | Attending: Infectious Diseases | Admitting: Infectious Diseases

## 2013-03-13 ENCOUNTER — Encounter: Payer: Self-pay | Admitting: Infectious Diseases

## 2013-03-13 ENCOUNTER — Ambulatory Visit (INDEPENDENT_AMBULATORY_CARE_PROVIDER_SITE_OTHER): Payer: BC Managed Care – PPO | Admitting: Infectious Diseases

## 2013-03-13 VITALS — BP 118/84 | HR 92 | Temp 98.2°F | Ht 66.0 in | Wt 138.0 lb

## 2013-03-13 DIAGNOSIS — R109 Unspecified abdominal pain: Secondary | ICD-10-CM

## 2013-03-13 DIAGNOSIS — E119 Type 2 diabetes mellitus without complications: Secondary | ICD-10-CM

## 2013-03-13 DIAGNOSIS — J984 Other disorders of lung: Secondary | ICD-10-CM

## 2013-03-13 DIAGNOSIS — R911 Solitary pulmonary nodule: Secondary | ICD-10-CM

## 2013-03-13 DIAGNOSIS — R079 Chest pain, unspecified: Secondary | ICD-10-CM | POA: Insufficient documentation

## 2013-03-13 MED ORDER — ALUMINUM & MAGNESIUM HYDROXIDE 225-200 MG/5ML PO SUSP: 10.0000 mL | Freq: Four times a day (QID) | ORAL | Status: DC | PRN

## 2013-03-13 NOTE — Assessment & Plan Note (Signed)
Needs PCP, f/u. Wants help with neuropathy.

## 2013-03-13 NOTE — Progress Notes (Signed)
  Subjective:    Patient ID: Adam Horton, male    DOB: 1965-09-09, 47 y.o.   MRN: [id number]  HPI 47 yo [place] M in [place] since 2002, with a hx of Pulmonary TB: Culture+ [place], [place]. Treated April 2012--> December-2012 (extended course due to intolerance of PZA). [facility] [place], ct with nodules, cavities [phone])  He was adm to [facility] 5-23 to 5-31 on with abd pain. He was found on CT to have a cavitary lung lesion and liver lesion. He underwent BAL 5-30, afb stain (-). He was quantiferon (+). His Cx grew MAI.  HIV (-), HCV (+).   He has no cough now, has occas chest pain. Having leg pain as well. No fever or chills. No hemoptysis. No pain in his neck or arm. Feels like squeezing. Gets nausea, headache if rising too quickly after pain. Pain is improved with Naprosyn.  Wt down 4#, no dysphagia but does have feeling that something is stuck in his throat. Wants medicine to help his appetite.   Review of Systems  Constitutional: Positive for unexpected weight change. Negative for appetite change.  Respiratory: Negative for cough and shortness of breath.   Cardiovascular: Positive for chest pain.       Objective:   Physical Exam  Constitutional: He appears well-developed and well-nourished.  HENT:  Mouth/Throat: No oropharyngeal exudate.  Eyes: EOM are normal. Pupils are equal, round, and reactive to light.  Neck: Neck supple.  Cardiovascular: Normal rate, regular rhythm and normal heart sounds.   Pulmonary/Chest: Effort normal and breath sounds normal. He exhibits no tenderness.  Abdominal: Soft. Bowel sounds are normal. There is no tenderness.  Lymphadenopathy:    He has no cervical adenopathy.          Assessment & Plan:

## 2013-03-13 NOTE — Assessment & Plan Note (Addendum)
His sx seem most consistent with severe GERD. Will give him GI cocktail which helped him previously. Will have him seen by GI. Would he benefit from propulsid like agent? Will check ECG with his hx of DM.   Addendum: NSR 80 bpm.

## 2013-03-13 NOTE — Assessment & Plan Note (Addendum)
He had a sputum + for MAI but his BAL did not grow this. Will defer on treating this at this point, his cavitary lung lesions are most likely from his previous TB, treated in 2012.

## 2013-03-23 ENCOUNTER — Telehealth: Payer: Self-pay | Admitting: Internal Medicine

## 2013-03-23 ENCOUNTER — Other Ambulatory Visit: Payer: Self-pay | Admitting: *Deleted

## 2013-03-23 DIAGNOSIS — K219 Gastro-esophageal reflux disease without esophagitis: Secondary | ICD-10-CM

## 2013-03-23 DIAGNOSIS — E119 Type 2 diabetes mellitus without complications: Secondary | ICD-10-CM

## 2013-03-23 DIAGNOSIS — G8929 Other chronic pain: Secondary | ICD-10-CM

## 2013-03-24 NOTE — Telephone Encounter (Signed)
Unable to reach patient   Voice mail not set up

## 2013-03-24 NOTE — Telephone Encounter (Signed)
Spoke with patient and scheduled OV with Mike Gip, PA on 03/28/13 at 11:00 AM.

## 2013-03-28 ENCOUNTER — Encounter: Payer: Self-pay | Admitting: Physician Assistant

## 2013-03-28 ENCOUNTER — Other Ambulatory Visit (INDEPENDENT_AMBULATORY_CARE_PROVIDER_SITE_OTHER): Payer: BC Managed Care – PPO

## 2013-03-28 ENCOUNTER — Other Ambulatory Visit: Payer: Self-pay | Admitting: *Deleted

## 2013-03-28 ENCOUNTER — Ambulatory Visit (INDEPENDENT_AMBULATORY_CARE_PROVIDER_SITE_OTHER): Payer: BC Managed Care – PPO | Admitting: Physician Assistant

## 2013-03-28 VITALS — BP 90/70 | HR 68 | Ht 66.0 in | Wt 135.0 lb

## 2013-03-28 DIAGNOSIS — R935 Abnormal findings on diagnostic imaging of other abdominal regions, including retroperitoneum: Secondary | ICD-10-CM

## 2013-03-28 DIAGNOSIS — Z8619 Personal history of other infectious and parasitic diseases: Secondary | ICD-10-CM

## 2013-03-28 DIAGNOSIS — K2970 Gastritis, unspecified, without bleeding: Secondary | ICD-10-CM

## 2013-03-28 DIAGNOSIS — B1920 Unspecified viral hepatitis C without hepatic coma: Secondary | ICD-10-CM

## 2013-03-28 DIAGNOSIS — K2990 Gastroduodenitis, unspecified, without bleeding: Secondary | ICD-10-CM

## 2013-03-28 DIAGNOSIS — R1013 Epigastric pain: Secondary | ICD-10-CM

## 2013-03-28 DIAGNOSIS — Z8719 Personal history of other diseases of the digestive system: Secondary | ICD-10-CM

## 2013-03-28 DIAGNOSIS — R070 Pain in throat: Secondary | ICD-10-CM

## 2013-03-28 LAB — BUN: BUN: 15 mg/dL (ref 6–23)

## 2013-03-28 MED ORDER — PANTOPRAZOLE SODIUM 40 MG PO TBEC: 40.0000 mg | DELAYED_RELEASE_TABLET | Freq: Two times a day (BID) | ORAL | Status: DC

## 2013-03-28 MED ORDER — TRAMADOL HCL 50 MG PO TABS: 50.0000 mg | ORAL_TABLET | Freq: Four times a day (QID) | ORAL | Status: DC | PRN

## 2013-03-28 NOTE — Progress Notes (Signed)
Subjective:    Patient ID: Adam Horton, male    DOB: 18-Apr-1966, 47 y.o.   MRN: [id number]  HPI Adam Horton is a 47 year old [place] male who was seen by Dr. Lina Sar in consult while hospitalized in May of 2014. At that time he had undergone upper endoscopy for complaints of abdominal pain and was found to have a mild acute gastritis. Stains for AFB were done and were negative. He had a CT of the abdomen pelvis and chest done with finding of a cavitary lung lesion. 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 He had a culture that grew MAI but BAL was negative he has not been treated. CT scan also showed multiple small foci of arterial enhancement within the liver and with history of hepatitis C could not rule out hepatocellular carcinoma MRI was recommended. He is known to be hepatitis C positive, also has diabetes and hypertension previously treated TB as mentioned above. He was discharged home on Protonix 40 mg by mouth daily. He comes back in today with complaints that are somewhat difficult to sort out even with a phone interpreter. He has complaint of headaches leg pains decrease in appetite abdominal pain sore throat there he has his medications with him and appears to be taking the Protonix 40 mg daily but also has a prescription for naproxen 500 mg a states she's been taking this twice daily. He denies any dysphagia or odynophagia but points to his throat and says it is uncomfortable. When asked he also complains of chest pain and says sometimes he has shortness of breath but to the best of my knowledge the symptoms have been long-standing over the past few years. He also complains of some dizziness. He asks for a different medicine to use for pain.    Review of Systems  Constitutional: Positive for appetite change and unexpected weight change.  HENT: Positive for sore throat.   Eyes: Negative.   Respiratory: Negative.   Cardiovascular:  Negative.   Gastrointestinal: Positive for abdominal pain.  Endocrine: Negative.   Genitourinary: Negative.   Musculoskeletal: Positive for arthralgias.  Allergic/Immunologic: Negative.   Neurological: Positive for headaches.  Hematological: Negative.   Psychiatric/Behavioral: Negative.    Outpatient Prescriptions Prior to Visit  Medication Sig Dispense Refill  . insulin detemir (LEVEMIR) 100 UNIT/ML injection Inject 0.15 mLs (15 Units total) into the skin daily.      Marland Kitchen lisinopril (PRINIVIL,ZESTRIL) 5 MG tablet Take 2.5 mg by mouth daily.      . metformin (FORTAMET) 500 MG (OSM) 24 hr tablet Take 1,000 mg by mouth 2 (two) times daily with a meal.      . naproxen (NAPROSYN) 500 MG tablet Take 500 mg by mouth 2 (two) times daily as needed (pain).       Marland Kitchen OVER THE COUNTER MEDICATION Place 1 drop into the left eye 2 (two) times daily as needed (otc eye drop prn pain).      Marland Kitchen aluminum & magnesium hydroxide (MAALOX) 225-200 MG/5ML suspension Take 10 mLs by mouth every 6 (six) hours as needed for indigestion.  355 mL  0  . metoCLOPramide (REGLAN) 10 MG tablet Take 1 tablet (10 mg total) by mouth every 6 (six) hours. As needed for nausea or vomiting  30 tablet  0  . omeprazole (PRILOSEC) 20 MG capsule Take 20 mg by mouth 2 (two) times daily.       No facility-administered medications  prior to visit.   No Known Allergies Patient Active Problem List   Diagnosis Date Noted  . History of acute gastritis 03/28/2013  . Hepatitis C 03/28/2013  . History of tuberculosis 01/28/2013  . Lesion of lung 01/27/2013  . Abdominal pain 01/20/2013  . DM (diabetes mellitus) 01/20/2013  . HTN (hypertension) 01/20/2013  . Nonspecific (abnormal) findings on radiological and other examination of biliary tract 01/20/2013  . Other abnormal blood chemistry 01/20/2013   History  Substance Use Topics  . Smoking status: Never Smoker   . Smokeless tobacco: Never Used  . Alcohol Use: No      family history is  not on file.  Objective:   Physical Exam  well-developed [place] male in no acute distress. He speaks little [place]. Blood pressure 90/70 pulse 68 height 5 foot 6 weight 135. HEENT; nontraumatic normocephalic EOMI PERRLA sclera anicteric, Supple; no JVD, Cardiovascular; regular rate and rhythm with S1-S2 no murmur or gallop, Pulmonary; clear bilaterally, Abdomen; soft she is minimally tender in the epigastrium there is no palpable mass or hepatosplenomegaly bowel sounds are present, Rectal; exam not done, Extremities; no clubbing cyanosis or edema skin warm and dry, Psych; mood and affect appear appropriate.        Assessment & Plan:  #75 47 year old [place] male with significant language barrier with recent EGD showing acute gastritis. Patient is on proton next however he is also on a twice daily prescription strength naproxen which is probably aggravating his abdominal pain and may be causing some esophagitis. #2 history of previously treated pulmonary TB #3 hepatitis C not treated-hospital records state he was referred to the hepatitis C clinic #4 abnormal CT of the abdomen/liver #5 diabetes mellitus #6 hypertension  Plan; patient seems to have multiple various constitutional symptoms he does not have a primary care provider and we will try to get him an appointment with the [facility] Will increase Protonix to 40 mg by mouth twice daily x1 month then back to once daily Add Carafate suspension twice daily-for one week samples were given Stop naproxen Ultram 50 mg every 6 hours as needed for pain #40 and no refills Scheduled for MRI of the liver to rule out hepatocellular carcinoma  Care of this patient is difficult due  significant language barrier, the patient commented that he did not feel that the interpreter understood him well.

## 2013-03-28 NOTE — Patient Instructions (Addendum)
Please go to the basement level to have your labs drawn.  You have been scheduled for an MRI at [facility] on Friday 03-31-2013 Your appointment time is 12:00 Noon. Please arrive at 11;45 am. prior to your appointment time for registration purposes. There is no prep for this test. However, if you have any metal in your body, have a pacemaker or defibrillator, please be sure to let your ordering physician know. This test typically takes 45 minutes to 1 hour to complete.  Npo 4 hours- eat nothing past 8:00 am .   STOP Naproxen.  [facility] prescriptions for Ultram and Protonix.

## 2013-03-28 NOTE — Progress Notes (Signed)
Reviewed and agree.

## 2013-03-31 ENCOUNTER — Other Ambulatory Visit: Payer: Self-pay | Admitting: Physician Assistant

## 2013-03-31 ENCOUNTER — Ambulatory Visit (HOSPITAL_COMMUNITY)
Admission: RE | Admit: 2013-03-31 | Discharge: 2013-03-31 | Disposition: A | Discharge status: Home / Self Care | Payer: BC Managed Care – PPO | Source: Ambulatory Visit | Attending: Physician Assistant | Admitting: Physician Assistant

## 2013-03-31 DIAGNOSIS — N289 Disorder of kidney and ureter, unspecified: Secondary | ICD-10-CM | POA: Insufficient documentation

## 2013-03-31 DIAGNOSIS — R935 Abnormal findings on diagnostic imaging of other abdominal regions, including retroperitoneum: Secondary | ICD-10-CM

## 2013-03-31 DIAGNOSIS — K7689 Other specified diseases of liver: Secondary | ICD-10-CM | POA: Insufficient documentation

## 2013-03-31 DIAGNOSIS — R948 Abnormal results of function studies of other organs and systems: Secondary | ICD-10-CM | POA: Insufficient documentation

## 2013-03-31 DIAGNOSIS — Z8619 Personal history of other infectious and parasitic diseases: Secondary | ICD-10-CM

## 2013-03-31 MED ORDER — GADOBENATE DIMEGLUMINE 529 MG/ML IV SOLN
15.0000 mL | Freq: Once | INTRAVENOUS | Status: AC | PRN
  Administered 2013-03-31: 12 mL via INTRAVENOUS

## 2013-04-05 ENCOUNTER — Telehealth: Payer: Self-pay | Admitting: Physician Assistant

## 2013-04-05 NOTE — Telephone Encounter (Signed)
See results note. 

## 2013-04-12 ENCOUNTER — Other Ambulatory Visit: Payer: Self-pay | Admitting: Physician Assistant

## 2013-04-13 ENCOUNTER — Other Ambulatory Visit: Payer: Self-pay | Admitting: *Deleted

## 2013-04-13 NOTE — Telephone Encounter (Signed)
Ok x 1

## 2013-05-17 ENCOUNTER — Other Ambulatory Visit: Payer: Self-pay | Admitting: Physician Assistant

## 2013-06-15 ENCOUNTER — Ambulatory Visit (INDEPENDENT_AMBULATORY_CARE_PROVIDER_SITE_OTHER): Payer: BC Managed Care – PPO | Admitting: Internal Medicine

## 2013-06-15 ENCOUNTER — Telehealth: Payer: Self-pay | Admitting: Internal Medicine

## 2013-06-15 ENCOUNTER — Other Ambulatory Visit (INDEPENDENT_AMBULATORY_CARE_PROVIDER_SITE_OTHER): Payer: BC Managed Care – PPO

## 2013-06-15 ENCOUNTER — Other Ambulatory Visit: Payer: Self-pay | Admitting: Physician Assistant

## 2013-06-15 ENCOUNTER — Encounter: Payer: Self-pay | Admitting: Internal Medicine

## 2013-06-15 VITALS — BP 124/82 | HR 76 | Temp 96.6°F | Resp 16 | Ht 66.0 in | Wt 135.0 lb

## 2013-06-15 DIAGNOSIS — I10 Essential (primary) hypertension: Secondary | ICD-10-CM

## 2013-06-15 DIAGNOSIS — Z23 Encounter for immunization: Secondary | ICD-10-CM

## 2013-06-15 DIAGNOSIS — B1920 Unspecified viral hepatitis C without hepatic coma: Secondary | ICD-10-CM

## 2013-06-15 DIAGNOSIS — IMO0001 Reserved for inherently not codable concepts without codable children: Secondary | ICD-10-CM

## 2013-06-15 DIAGNOSIS — Z0000 Encounter for general adult medical examination without abnormal findings: Secondary | ICD-10-CM

## 2013-06-15 DIAGNOSIS — E559 Vitamin D deficiency, unspecified: Secondary | ICD-10-CM

## 2013-06-15 DIAGNOSIS — D510 Vitamin B12 deficiency anemia due to intrinsic factor deficiency: Secondary | ICD-10-CM

## 2013-06-15 DIAGNOSIS — R079 Chest pain, unspecified: Secondary | ICD-10-CM

## 2013-06-15 LAB — LIPID PANEL
Cholesterol: 195 mg/dL (ref 0–200)
HDL: 35.8 mg/dL — ABNORMAL LOW (ref >39.00)
Total CHOL/HDL Ratio: 5
Triglycerides: 276 mg/dL — ABNORMAL HIGH (ref 0.0–149.0)
VLDL: 55.2 mg/dL — ABNORMAL HIGH (ref 0.0–40.0)

## 2013-06-15 LAB — FERRITIN: Ferritin: 265.5 ng/mL (ref 22.0–322.0)

## 2013-06-15 LAB — COMPREHENSIVE METABOLIC PANEL
AST: 55 U/L — ABNORMAL HIGH (ref 0–37)
Albumin: 4.1 g/dL (ref 3.5–5.2)
BUN: 18 mg/dL (ref 6–23)
CO2: 31 mEq/L (ref 19–32)
Calcium: 9.4 mg/dL (ref 8.4–10.5)
Chloride: 95 mEq/L — ABNORMAL LOW (ref 96–112)
Creatinine, Ser: 1.1 mg/dL (ref 0.4–1.5)
GFR: 78.81 mL/min (ref >60.00)
Potassium: 4.7 mEq/L (ref 3.5–5.1)
Total Protein: 7.6 g/dL (ref 6.0–8.3)

## 2013-06-15 LAB — CBC WITH DIFFERENTIAL/PLATELET
Basophils Absolute: 0 10*3/uL (ref 0.0–0.1)
Basophils Relative: 0.5 % (ref 0.0–3.0)
Eosinophils Absolute: 0.1 10*3/uL (ref 0.0–0.7)
Hemoglobin: 15.3 g/dL (ref 13.0–17.0)
Lymphocytes Relative: 42.3 % (ref 12.0–46.0)
MCHC: 32 g/dL (ref 30.0–36.0)
Monocytes Absolute: 0.5 10*3/uL (ref 0.1–1.0)
Monocytes Relative: 10.1 % (ref 3.0–12.0)
Neutrophils Relative %: 45.4 % (ref 43.0–77.0)
Platelets: 224 10*3/uL (ref 150.0–400.0)
RBC: 6.52 Mil/uL — ABNORMAL HIGH (ref 4.22–5.81)
RDW: 14.1 % (ref 11.5–14.6)

## 2013-06-15 LAB — FOLATE: Folate: 9.8 ng/mL (ref >5.9)

## 2013-06-15 LAB — URINALYSIS, ROUTINE W REFLEX MICROSCOPIC
Bilirubin Urine: NEGATIVE
Hgb urine dipstick: NEGATIVE
Leukocytes, UA: NEGATIVE
Nitrite: NEGATIVE
Total Protein, Urine: NEGATIVE
Urine Glucose: >=1000
Urobilinogen, UA: 0.2 (ref 0.0–1.0)
WBC, UA: NONE SEEN (ref 0–?)

## 2013-06-15 LAB — HM DIABETES FOOT EXAM

## 2013-06-15 LAB — PSA: PSA: 0.37 ng/mL (ref 0.10–4.00)

## 2013-06-15 LAB — IBC PANEL
Iron: 90 ug/dL (ref 42–165)
Saturation Ratios: 21.8 % (ref 20.0–50.0)
Transferrin: 294.4 mg/dL (ref 212.0–360.0)

## 2013-06-15 LAB — C-PEPTIDE: C-Peptide: 2.28 ng/mL (ref 0.80–3.90)

## 2013-06-15 LAB — MICROALBUMIN / CREATININE URINE RATIO
Creatinine,U: 35.3 mg/dL
Microalb Creat Ratio: 1.1 mg/g (ref 0.0–30.0)
Microalb, Ur: 0.4 mg/dL (ref 0.0–1.9)

## 2013-06-15 LAB — CARDIAC PANEL
Relative Index: 3.3 calc — ABNORMAL HIGH (ref 0.0–2.5)
Total CK: 96 U/L (ref 7–232)

## 2013-06-15 LAB — HEMOGLOBIN A1C: Hgb A1c MFr Bld: 10.5 % — ABNORMAL HIGH (ref 4.6–6.5)

## 2013-06-15 LAB — FECAL OCCULT BLOOD, GUAIAC: Fecal Occult Blood: NEGATIVE

## 2013-06-15 LAB — TSH: TSH: 0.67 u[IU]/mL (ref 0.35–5.50)

## 2013-06-15 MED ORDER — PANTOPRAZOLE SODIUM 40 MG PO TBEC: 40.0000 mg | DELAYED_RELEASE_TABLET | Freq: Every day | ORAL | Status: DC

## 2013-06-15 NOTE — Assessment & Plan Note (Signed)
He has no s/s of blood loss, I will recheck his CBC and will look at his vitamin levels as well

## 2013-06-15 NOTE — Assessment & Plan Note (Signed)
Exam done Vaccines were updated Labs ordered Pt ed material was given 

## 2013-06-15 NOTE — Progress Notes (Signed)
Subjective:    Patient ID: Adam Horton, male    DOB: 09-Jul-1966, 47 y.o.   MRN: [id number]  Chest Pain  This is a chronic problem. The current episode started more than 1 year ago. The onset quality is gradual. The problem occurs intermittently. The problem has been unchanged. The pain is present in the substernal region. The pain is at a severity of 1/10. The pain is mild. The quality of the pain is described as burning. The pain does not radiate. Associated symptoms include dizziness. Pertinent negatives include no abdominal pain, back pain, claudication, cough, diaphoresis, exertional chest pressure, fever, headaches, hemoptysis, irregular heartbeat, leg pain, lower extremity edema, malaise/fatigue, nausea, near-syncope, numbness, orthopnea, palpitations, PND, shortness of breath, sputum production, syncope, vomiting or weakness. The pain is aggravated by nothing. He has tried nothing for the symptoms.      Review of Systems  Constitutional: Negative for fever, malaise/fatigue, diaphoresis and fatigue.  HENT: Negative.   Respiratory: Negative.  Negative for apnea, cough, hemoptysis, sputum production, choking, chest tightness, shortness of breath, wheezing and stridor.   Cardiovascular: Positive for chest pain. Negative for palpitations, orthopnea, claudication, leg swelling, syncope, PND and near-syncope.  Gastrointestinal: Negative.  Negative for nausea, vomiting, abdominal pain, diarrhea, constipation, abdominal distention and anal bleeding.  Endocrine: Positive for polydipsia, polyphagia and polyuria.  Genitourinary: Negative.   Musculoskeletal: Negative.  Negative for arthralgias, back pain, gait problem, joint swelling, myalgias, neck pain and neck stiffness.  Skin: Negative.   Allergic/Immunologic: Negative.   Neurological: Positive for dizziness. Negative for tremors, speech difficulty, weakness, light-headedness, numbness and headaches.  Hematological: Negative.  Negative for  adenopathy. Does not bruise/bleed easily.  Psychiatric/Behavioral: Negative.        Objective:   Physical Exam  Vitals reviewed. Constitutional: He is oriented to person, place, and time. He appears well-developed and well-nourished. No distress.  HENT:  Head: Normocephalic and atraumatic.  Mouth/Throat: Oropharynx is clear and moist. No oropharyngeal exudate.  Eyes: Conjunctivae are normal. Right eye exhibits no discharge. Left eye exhibits no discharge. No scleral icterus.  Neck: Normal range of motion. Neck supple. No JVD present. No tracheal deviation present. No thyromegaly present.  Cardiovascular: Normal rate, regular rhythm, normal heart sounds and intact distal pulses.  Exam reveals no gallop and no friction rub.   No murmur heard. Pulmonary/Chest: Effort normal and breath sounds normal. No stridor. No respiratory distress. He has no wheezes. He has no rales. He exhibits no tenderness.  Abdominal: Soft. Bowel sounds are normal. He exhibits no distension and no mass. There is no tenderness. There is no rebound and no guarding. Hernia confirmed negative in the right inguinal area and confirmed negative in the left inguinal area.  Genitourinary: Prostate normal, testes normal and penis normal. Rectal exam shows no external hemorrhoid, no internal hemorrhoid, no fissure, no mass, no tenderness and anal tone normal. Guaiac negative stool. Prostate is not enlarged and not tender. Right testis shows no mass, no swelling and no tenderness. Right testis is descended. Left testis shows no mass, no swelling and no tenderness. Left testis is descended. Uncircumcised. No phimosis, paraphimosis, hypospadias, penile erythema or penile tenderness. No discharge found.  Musculoskeletal: Normal range of motion. He exhibits no edema and no tenderness.  Lymphadenopathy:    He has no cervical adenopathy.       Right: No inguinal adenopathy present.       Left: No inguinal adenopathy present.   Neurological: He is oriented to person, place, and time.  Skin: Skin is warm and dry. No rash noted. He is not diaphoretic. No erythema. No pallor.  Psychiatric: He has a normal mood and affect. His behavior is normal. Judgment and thought content normal.     Lab Results  Component Value Date   WBC 4.2 01/26/2013   HGB 11.8* 01/26/2013   HCT 36.0* 01/26/2013   PLT 198 01/26/2013   GLUCOSE 194* 01/27/2013   ALT 74* 01/23/2013   AST 49* 01/23/2013   NA 138 01/27/2013   K 3.8 01/27/2013   CL 103 01/27/2013   CREATININE 0.9 03/28/2013   BUN 15 03/28/2013   CO2 27 01/27/2013   INR 1.04 01/27/2013       Assessment & Plan:

## 2013-06-15 NOTE — Assessment & Plan Note (Signed)
He does not want to treat this at this time I am not sure he is a candidate either

## 2013-06-15 NOTE — Patient Instructions (Signed)
Type 2 Diabetes Mellitus, Adult Type 2 diabetes mellitus, often simply referred to as type 2 diabetes, is a long-lasting (chronic) disease. In type 2 diabetes, the pancreas does not make enough insulin (a hormone), the cells are less responsive to the insulin that is made (insulin resistance), or both. Normally, insulin moves sugars from food into the tissue cells. The tissue cells use the sugars for energy. The lack of insulin or the lack of normal response to insulin causes excess sugars to build up in the blood instead of going into the tissue cells. As a result, high blood sugar (hyperglycemia) develops. The effect of high sugar (glucose) levels can cause many complications. Type 2 diabetes was also previously called adult-onset diabetes but it can occur at any age.  RISK FACTORS  A person is predisposed to developing type 2 diabetes if someone in the family has the disease and also has one or more of the following primary risk factors:  Overweight.  An inactive lifestyle.  A history of consistently eating high-calorie foods. Maintaining a normal weight and regular physical activity can reduce the chance of developing type 2 diabetes. SYMPTOMS  A person with type 2 diabetes may not show symptoms initially. The symptoms of type 2 diabetes appear slowly. The symptoms include:  Increased thirst (polydipsia).  Increased urination (polyuria).  Increased urination during the night (nocturia).  Weight loss. This weight loss may be rapid.  Frequent, recurring infections.  Tiredness (fatigue).  Weakness.  Vision changes, such as blurred vision.  Fruity smell to your breath.  Abdominal pain.  Nausea or vomiting.  Cuts or bruises which are slow to heal.  Tingling or numbness in the hands or feet. DIAGNOSIS Type 2 diabetes is frequently not diagnosed until complications of diabetes are present. Type 2 diabetes is diagnosed when symptoms or complications are present and when blood  glucose levels are increased. Your blood glucose level may be checked by one or more of the following blood tests:  A fasting blood glucose test. You will not be allowed to eat for at least 8 hours before a blood sample is taken.  A random blood glucose test. Your blood glucose is checked at any time of the day regardless of when you ate.  A hemoglobin A1c blood glucose test. A hemoglobin A1c test provides information about blood glucose control over the previous 3 months.  An oral glucose tolerance test (OGTT). Your blood glucose is measured after you have not eaten (fasted) for 2 hours and then after you drink a glucose-containing beverage. TREATMENT   You may need to take insulin or diabetes medicine daily to keep blood glucose levels in the desired range.  You will need to match insulin dosing with exercise and healthy food choices. The treatment goal is to maintain the before meal blood sugar (preprandial glucose) level at 70 130 mg/dL. HOME CARE INSTRUCTIONS   Have your hemoglobin A1c level checked twice a year.  Perform daily blood glucose monitoring as directed by your caregiver.  Monitor urine ketones when you are ill and as directed by your caregiver.  Take your diabetes medicine or insulin as directed by your caregiver to maintain your blood glucose levels in the desired range.  Never run out of diabetes medicine or insulin. It is needed every day.  Adjust insulin based on your intake of carbohydrates. Carbohydrates can raise blood glucose levels but need to be included in your diet. Carbohydrates provide vitamins, minerals, and fiber which are an essential part of   a healthy diet. Carbohydrates are found in fruits, vegetables, whole grains, dairy products, legumes, and foods containing added sugars.    Eat healthy foods. Alternate 3 meals with 3 snacks.  Lose weight if overweight.  Carry a medical alert card or wear your medical alert jewelry.  Carry a 15 gram  carbohydrate snack with you at all times to treat low blood glucose (hypoglycemia). Some examples of 15 gram carbohydrate snacks include:  Glucose tablets, 3 or 4   Glucose gel, 15 gram tube  Raisins, 2 tablespoons (24 grams)  Jelly beans, 6  Animal crackers, 8  Regular pop, 4 ounces (120 mL)  Gummy treats, 9  Recognize hypoglycemia. Hypoglycemia occurs with blood glucose levels of 70 mg/dL and below. The risk for hypoglycemia increases when fasting or skipping meals, during or after intense exercise, and during sleep. Hypoglycemia symptoms can include:  Tremors or shakes.  Decreased ability to concentrate.  Sweating.  Increased heart rate.  Headache.  Dry mouth.  Hunger.  Irritability.  Anxiety.  Restless sleep.  Altered speech or coordination.  Confusion.  Treat hypoglycemia promptly. If you are alert and able to safely swallow, follow the 15:15 rule:  Take 15 20 grams of rapid-acting glucose or carbohydrate. Rapid-acting options include glucose gel, glucose tablets, or 4 ounces (120 mL) of fruit juice, regular soda, or low fat milk.  Check your blood glucose level 15 minutes after taking the glucose.  Take 15 20 grams more of glucose if the repeat blood glucose level is still 70 mg/dL or below.  Eat a meal or snack within 1 hour once blood glucose levels return to normal.    Be alert to polyuria and polydipsia which are early signs of hyperglycemia. An early awareness of hyperglycemia allows for prompt treatment. Treat hyperglycemia as directed by your caregiver.  Engage in at least 150 minutes of moderate-intensity physical activity a week, spread over at least 3 days of the week or as directed by your caregiver. In addition, you should engage in resistance exercise at least 2 times a week or as directed by your caregiver.  Adjust your medicine and food intake as needed if you start a new exercise or sport.  Follow your sick day plan at any time you  are unable to eat or drink as usual.  Avoid tobacco use.  Limit alcohol intake to no more than 1 drink per day for nonpregnant women and 2 drinks per day for men. You should drink alcohol only when you are also eating food. Talk with your caregiver whether alcohol is safe for you. Tell your caregiver if you drink alcohol several times a week.  Follow up with your caregiver regularly.  Schedule an eye exam soon after the diagnosis of type 2 diabetes and then annually.  Perform daily skin and foot care. Examine your skin and feet daily for cuts, bruises, redness, nail problems, bleeding, blisters, or sores. A foot exam by a caregiver should be done annually.  Brush your teeth and gums at least twice a day and floss at least once a day. Follow up with your dentist regularly.  Share your diabetes management plan with your workplace or school.  Stay up-to-date with immunizations.  Learn to manage stress.  Obtain ongoing diabetes education and support as needed.  Participate in, or seek rehabilitation as needed to maintain or improve independence and quality of life. Request a physical or occupational therapy referral if you are having foot or hand numbness or difficulties with grooming,   dressing, eating, or physical activity. SEEK MEDICAL CARE IF:   You are unable to eat food or drink fluids for more than 6 hours.  You have nausea and vomiting for more than 6 hours.  Your blood glucose level is over 240 mg/dL.  There is a change in mental status.  You develop an additional serious illness.  You have diarrhea for more than 6 hours.  You have been sick or have had a fever for a couple of days and are not getting better.  You have pain during any physical activity.  SEEK IMMEDIATE MEDICAL CARE IF:  You have difficulty breathing.  You have moderate to large ketone levels. MAKE SURE YOU:  Understand these instructions.  Will watch your condition.  Will get help right away if  you are not doing well or get worse. Document Released: 08/17/2005 Document Revised: 05/11/2012 Document Reviewed: 03/15/2012 ExitCare Patient Information 2014 [employer]. Health Maintenance, Males A healthy lifestyle and preventative care can promote health and wellness.  Maintain regular health, dental, and eye exams.  Eat a healthy diet. Foods like vegetables, fruits, whole grains, low-fat dairy products, and lean protein foods contain the nutrients you need without too many calories. Decrease your intake of foods high in solid fats, added sugars, and salt. Get information about a proper diet from your caregiver, if necessary.  Regular physical exercise is one of the most important things you can do for your health. Most adults should get at least 150 minutes of moderate-intensity exercise (any activity that increases your heart rate and causes you to sweat) each week. In addition, most adults need muscle-strengthening exercises on 2 or more days a week.   Maintain a healthy weight. The body mass index (BMI) is a screening tool to identify possible weight problems. It provides an estimate of body fat based on height and weight. Your caregiver can help determine your BMI, and can help you achieve or maintain a healthy weight. For adults 20 years and older:  A BMI below 18.5 is considered underweight.  A BMI of 18.5 to 24.9 is normal.  A BMI of 25 to 29.9 is considered overweight.  A BMI of 30 and above is considered obese.  Maintain normal blood lipids and cholesterol by exercising and minimizing your intake of saturated fat. Eat a balanced diet with plenty of fruits and vegetables. Blood tests for lipids and cholesterol should begin at age 20 and be repeated every 5 years. If your lipid or cholesterol levels are high, you are over 50, or you are a high risk for heart disease, you may need your cholesterol levels checked more frequently.Ongoing high lipid and cholesterol levels should  be treated with medicines, if diet and exercise are not effective.  If you smoke, find out from your caregiver how to quit. If you do not use tobacco, do not start.  If you choose to drink alcohol, do not exceed 2 drinks per day. One drink is considered to be 12 ounces (355 mL) of beer, 5 ounces (148 mL) of wine, or 1.5 ounces (44 mL) of liquor.  Avoid use of street drugs. Do not share needles with anyone. Ask for help if you need support or instructions about stopping the use of drugs.  High blood pressure causes heart disease and increases the risk of stroke. Blood pressure should be checked at least every 1 to 2 years. Ongoing high blood pressure should be treated with medicines if weight loss and exercise are not effective.    If you are 45 to 46 years old, ask your caregiver if you should take aspirin to prevent heart disease.  Diabetes screening involves taking a blood sample to check your fasting blood sugar level. This should be done once every 3 years, after age 45, if you are within normal weight and without risk factors for diabetes. Testing should be considered at a younger age or be carried out more frequently if you are overweight and have at least 1 risk factor for diabetes.  Colorectal cancer can be detected and often prevented. Most routine colorectal cancer screening begins at the age of 50 and continues through age 75. However, your caregiver may recommend screening at an earlier age if you have risk factors for colon cancer. On a yearly basis, your caregiver may provide home test kits to check for hidden blood in the stool. Use of a small camera at the end of a tube, to directly examine the colon (sigmoidoscopy or colonoscopy), can detect the earliest forms of colorectal cancer. Talk to your caregiver about this at age 50, when routine screening begins. Direct examination of the colon should be repeated every 5 to 10 years through age 75, unless early forms of pre-cancerous polyps or  small growths are found.  Hepatitis C blood testing is recommended for all people born from 1945 through 1965 and any individual with known risks for hepatitis C.  Healthy men should no longer receive prostate-specific antigen (PSA) blood tests as part of routine cancer screening. Consult with your caregiver about prostate cancer screening.  Testicular cancer screening is not recommended for adolescents or adult males who have no symptoms. Screening includes self-exam, caregiver exam, and other screening tests. Consult with your caregiver about any symptoms you have or any concerns you have about testicular cancer.  Practice safe sex. Use condoms and avoid high-risk sexual practices to reduce the spread of sexually transmitted infections (STIs).  Use sunscreen with a sun protection factor (SPF) of 30 or greater. Apply sunscreen liberally and repeatedly throughout the day. You should seek shade when your shadow is shorter than you. Protect yourself by wearing long sleeves, pants, a wide-brimmed hat, and sunglasses year round, whenever you are outdoors.  Notify your caregiver of new moles or changes in moles, especially if there is a change in shape or color. Also notify your caregiver if a mole is larger than the size of a pencil eraser.  A one-time screening for abdominal aortic aneurysm (AAA) and surgical repair of large AAAs by sound wave imaging (ultrasonography) is recommended for ages 65 to 75 years who are current or former smokers.  Stay current with your immunizations. Document Released: 02/13/2008 Document Revised: 11/09/2011 Document Reviewed: 01/12/2011 ExitCare Patient Information 2014 [employer].  

## 2013-06-15 NOTE — Assessment & Plan Note (Signed)
Today his EKG shows early repol in the anterior leads, this is unchanged from his prior EKG of July 2014 I will check his D-dimer to see if there is concern for a PE and will check cardiac enzymes to screen for ischemia

## 2013-06-15 NOTE — Assessment & Plan Note (Signed)
BP is well controlled I will check his lytes and renal function 

## 2013-06-15 NOTE — Telephone Encounter (Signed)
06/15/2013   Pt needs 2 RX refilled.  Sucralfate 1 gm Tablet and Hyrdocodon-Acetaminophen 5-325.  Both were prescribed by John-Adam Bonk, Dr. At [facility] hospital.  Please advise.

## 2013-06-15 NOTE — Telephone Encounter (Signed)
Rx for Protonix sent to patient's pharmacy. I have spoken to Doug Sou, PA-C who has reviewed the patient's notes and states that Tramadol refill is no longer appropriate for the patient. He should get from his PCP if he continues to require it.

## 2013-06-15 NOTE — Telephone Encounter (Signed)
I don't think he should be on these meds long term, he should ask GI about the carafate and I am not willing to prescribe the hydrocodone

## 2013-06-15 NOTE — Assessment & Plan Note (Addendum)
I will check his A1C and will advise further Will also monitor his renal function He was referred for an eye exam  Late note - his blood glc is very high, I have asked him to return to start insulin injections

## 2013-06-16 ENCOUNTER — Telehealth: Payer: Self-pay

## 2013-06-16 ENCOUNTER — Encounter: Payer: Self-pay | Admitting: Internal Medicine

## 2013-06-16 DIAGNOSIS — E559 Vitamin D deficiency, unspecified: Secondary | ICD-10-CM | POA: Insufficient documentation

## 2013-06-16 MED ORDER — CHOLECALCIFEROL 1.25 MG (50000 UT) PO TABS: 1.0000 | ORAL_TABLET | ORAL | Status: AC

## 2013-06-16 NOTE — Assessment & Plan Note (Signed)
He will start Vit D replacement therapy

## 2013-06-16 NOTE — Telephone Encounter (Signed)
Called pt//lm with other for pt to call and schedule follow up appointment as an result of abnormal lab.

## 2013-06-16 NOTE — Telephone Encounter (Signed)
Message copied by Sandi Mealy on Fri Jun 16, 2013  2:16 PM ------      Message from: Etta Grandchild      Created: Thu Jun 15, 2013  2:54 PM       LA - his blood sugar is very high, please ask him to come back to start insulin and to change his meds for diabetes ------

## 2013-06-16 NOTE — Telephone Encounter (Signed)
Letter mailed due to several unsuccessful attempts to reach pt.

## 2013-06-26 ENCOUNTER — Other Ambulatory Visit: Payer: Self-pay | Admitting: Internal Medicine

## 2013-07-24 ENCOUNTER — Other Ambulatory Visit: Payer: Self-pay | Admitting: Internal Medicine

## 2013-08-18 ENCOUNTER — Ambulatory Visit: Payer: BC Managed Care – PPO | Admitting: Internal Medicine

## 2013-08-18 DIAGNOSIS — Z0289 Encounter for other administrative examinations: Secondary | ICD-10-CM

## 2013-08-21 ENCOUNTER — Encounter: Payer: Self-pay | Admitting: *Deleted

## 2013-08-21 ENCOUNTER — Encounter: Payer: BC Managed Care – PPO | Attending: Internal Medicine | Admitting: *Deleted

## 2013-08-21 VITALS — Ht 66.0 in | Wt 134.5 lb

## 2013-08-21 DIAGNOSIS — Z713 Dietary counseling and surveillance: Secondary | ICD-10-CM | POA: Insufficient documentation

## 2013-08-21 DIAGNOSIS — IMO0001 Reserved for inherently not codable concepts without codable children: Secondary | ICD-10-CM

## 2013-08-21 DIAGNOSIS — E119 Type 2 diabetes mellitus without complications: Secondary | ICD-10-CM | POA: Insufficient documentation

## 2013-08-21 NOTE — Progress Notes (Signed)
Appt start time: 1015 end time:  1215.  Assessment:  Patient was seen on  08/21/13 for individual diabetes education. Used interpretor from [employer] [phone]). The beginning of the appointment, the patient was asking me about a pain medication he received when in the hospital in May that was injected and that he wanted a Rx to get that again. I explained that he would need to ask his MD about that, but he pursued this line of questioning for at least 20 minutes of the visit. Patient states he lives with cousin, but that he buys and cooks his own food. He states he SMBG 1-2 a day with reported range of 140 - 400 mg/dl. He also states he has symptoms of shaky and cold sweat, but has not tested his BG when this has occurred. He also stated that he takes his Levemir insulin every time he eats a meal, so it could be once, twice or three times a day. The notes from his hospitalization and from his last visit with Dr. Sanda Linger state to take 15 units daily.  Current HbA1c: 10.5% on 06/15/13  Preferred Learning Style: Auditory with interpretor  Learning Readiness:   Ready  MEDICATIONS: see list. Diabetes medications include Metformin and Levemir insulin  DIETARY INTAKE:  Everyday foods include rice at each meal, small amounts of fish and vegetables.  Avoided foods include fried foods, pork, beef, chicken.    24-hr recall:  B ( AM): rice white , 1/2 cup, with water to drink  Snk ( AM): fresh fruit  L ( PM): skips occasionally if in pain or worried about throwing up., if eats, has rice, vegetables, no meat, maybe fish, water to drink Snk ( PM): occasionally  D ( PM): rice again, sometimes with fish and vegetables,  Snk ( PM): none Beverages: water,   Usual physical activity: walks at the park for an hour   Estimated energy needs: 1400 calories 158 g carbohydrates 105 g protein 39 g fat   Intervention:   Due to language barrier, education took longer than usual.  Per assessment, I felt discussing his insulin administration was the priority today. He was confident that he had been told to take his insulin (Levemir) every time he ate a meal. Upon checking Epic notes, it was written as to be taken daily. I explained the action time of Levemir being 24 hours and that taking it 1 - 3 times a day would cause great variability in his BG's. The fact that his BG varies between 140 - 400 could be explained by the fact that he takes the insulin irregularly based on meals eaten. I suggested as a teaching opportunity that taking the Levemir once a day with a larger dose such as 30 units, he would probably see better results. I explained that I would check with his MD for permission and let him know the decision. I called MD office at lunch time today and left message.  Teaching Method Utilized: Auditory  Handouts given during visit include: Living Well with Diabetes  Insulin action handout  Barriers to learning/adherence to lifestyle change: language barrier  Diabetes self-care support plan:  None at this time.  Demonstrated degree of understanding via:  Discussion of info taught   Monitoring/Evaluation:  Dietary intake, exercise, insulin order clarification, and body weight in 6 week(s).

## 2013-09-19 ENCOUNTER — Encounter (INDEPENDENT_AMBULATORY_CARE_PROVIDER_SITE_OTHER): Payer: Self-pay

## 2013-09-19 ENCOUNTER — Encounter: Payer: Self-pay | Admitting: *Deleted

## 2013-09-19 ENCOUNTER — Encounter: Payer: BC Managed Care – PPO | Attending: Internal Medicine | Admitting: *Deleted

## 2013-09-19 DIAGNOSIS — E1165 Type 2 diabetes mellitus with hyperglycemia: Secondary | ICD-10-CM

## 2013-09-19 DIAGNOSIS — Z713 Dietary counseling and surveillance: Secondary | ICD-10-CM | POA: Insufficient documentation

## 2013-09-19 DIAGNOSIS — E119 Type 2 diabetes mellitus without complications: Secondary | ICD-10-CM | POA: Insufficient documentation

## 2013-09-19 DIAGNOSIS — IMO0001 Reserved for inherently not codable concepts without codable children: Secondary | ICD-10-CM

## 2013-09-19 NOTE — Progress Notes (Signed)
Appt start time: 1045 end time:  1145.  Assessment:  Patient was seen on  09/19/13 for individual diabetes education follow up. Used interpretor from [employer] [phone](# [phone]). He walks irregularly and still complains of groin and joint pain, continually asking for pain medication. I again explained that I cannot help with that, he needs to speak with his MD about that. He did state he has changed his Levemir dosing from each time he eats to only at night, as suggested at our last visit.  Current HbA1c: 10.5% on 06/15/13  Preferred Learning Style: Auditory with interpretor  Learning Readiness:   Ready  MEDICATIONS: see list. Diabetes medications include Metformin and Levemir insulin  DIETARY INTAKE:  Everyday foods include rice at each meal, small amounts of fish and vegetables.  Avoided foods include fried foods, pork, beef, chicken.    24-hr recall:  B ( AM): rice white , 1/2 cup, with water to drink  Snk ( AM): fresh fruit  L ( PM): skips occasionally if in pain or worried about throwing up., if eats, has rice, vegetables, no meat, maybe fish, water to drink Snk ( PM): occasionally  D ( PM): rice again, sometimes with fish and vegetables,  Snk ( PM): none Beverages: water,   Usual physical activity: walks at the park for an hour   Estimated energy needs: 1400 calories 158 g carbohydrates 105 g protein 39 g fat   Intervention:  Reviewed the rationale of taking Levemir once a day and commended him for making that change. He states he is taking 30 units at night. He may need a meal time insulin to accommodate excursions after meals. Encouraged him to not skip meals and we discussed the importance of a fairly consistent carb intake for less variability with his BGs.   Teaching Method Utilized: Auditory  Handouts given during visit include:  No new handouts today  Barriers to learning/adherence to lifestyle change: language barrier  Diabetes self-care  support plan:  None at this time.  Demonstrated degree of understanding via:  Discussion of info taught   Monitoring/Evaluation:  Dietary intake, exercise, insulin order clarification, and body weight PRN.

## 2013-11-11 ENCOUNTER — Other Ambulatory Visit: Payer: Self-pay | Admitting: Internal Medicine

## 2013-11-14 ENCOUNTER — Other Ambulatory Visit: Payer: Self-pay | Admitting: *Deleted

## 2013-11-14 MED ORDER — PANTOPRAZOLE SODIUM 40 MG PO TBEC: DELAYED_RELEASE_TABLET | ORAL | Status: DC

## 2013-12-27 ENCOUNTER — Other Ambulatory Visit: Payer: Self-pay | Admitting: Internal Medicine

## 2013-12-29 ENCOUNTER — Telehealth: Payer: Self-pay | Admitting: Internal Medicine

## 2013-12-29 NOTE — Telephone Encounter (Signed)
Patient would like refill on tramadol.  Patient has appt for Tuesday with Dr. Yetta BarreJones. Believe patient wants to see Dr. For  This refill.

## 2014-01-02 ENCOUNTER — Other Ambulatory Visit (INDEPENDENT_AMBULATORY_CARE_PROVIDER_SITE_OTHER): Payer: BC Managed Care – PPO

## 2014-01-02 ENCOUNTER — Encounter: Payer: Self-pay | Admitting: Internal Medicine

## 2014-01-02 ENCOUNTER — Ambulatory Visit (INDEPENDENT_AMBULATORY_CARE_PROVIDER_SITE_OTHER): Payer: BC Managed Care – PPO | Admitting: Internal Medicine

## 2014-01-02 VITALS — BP 118/82 | HR 81 | Temp 97.3°F | Resp 16 | Ht 66.0 in | Wt 136.5 lb

## 2014-01-02 DIAGNOSIS — E559 Vitamin D deficiency, unspecified: Secondary | ICD-10-CM

## 2014-01-02 DIAGNOSIS — IMO0001 Reserved for inherently not codable concepts without codable children: Secondary | ICD-10-CM

## 2014-01-02 DIAGNOSIS — E1165 Type 2 diabetes mellitus with hyperglycemia: Secondary | ICD-10-CM

## 2014-01-02 DIAGNOSIS — E781 Pure hyperglyceridemia: Secondary | ICD-10-CM | POA: Insufficient documentation

## 2014-01-02 DIAGNOSIS — E785 Hyperlipidemia, unspecified: Secondary | ICD-10-CM | POA: Insufficient documentation

## 2014-01-02 DIAGNOSIS — IMO0002 Reserved for concepts with insufficient information to code with codable children: Secondary | ICD-10-CM

## 2014-01-02 DIAGNOSIS — M1710 Unilateral primary osteoarthritis, unspecified knee: Secondary | ICD-10-CM

## 2014-01-02 DIAGNOSIS — B1920 Unspecified viral hepatitis C without hepatic coma: Secondary | ICD-10-CM

## 2014-01-02 DIAGNOSIS — M179 Osteoarthritis of knee, unspecified: Secondary | ICD-10-CM | POA: Insufficient documentation

## 2014-01-02 DIAGNOSIS — I10 Essential (primary) hypertension: Secondary | ICD-10-CM

## 2014-01-02 LAB — CBC WITH DIFFERENTIAL/PLATELET
Basophils Absolute: 0 10*3/uL (ref 0.0–0.1)
Basophils Relative: 0.3 % (ref 0.0–3.0)
EOS PCT: 0.4 % (ref 0.0–5.0)
Eosinophils Absolute: 0 10*3/uL (ref 0.0–0.7)
HEMATOCRIT: 42.2 % (ref 39.0–52.0)
HEMOGLOBIN: 13.6 g/dL (ref 13.0–17.0)
LYMPHS ABS: 2.1 10*3/uL (ref 0.7–4.0)
LYMPHS PCT: 49.8 % — AB (ref 12.0–46.0)
MCHC: 32.2 g/dL (ref 30.0–36.0)
MCV: 76.2 fl — ABNORMAL LOW (ref 78.0–100.0)
MONOS PCT: 11 % (ref 3.0–12.0)
Monocytes Absolute: 0.5 10*3/uL (ref 0.1–1.0)
Neutro Abs: 1.7 10*3/uL (ref 1.4–7.7)
Neutrophils Relative %: 38.5 % — ABNORMAL LOW (ref 43.0–77.0)
Platelets: 248 10*3/uL (ref 150.0–400.0)
RBC: 5.53 Mil/uL (ref 4.22–5.81)
RDW: 14.1 % (ref 11.5–14.6)
WBC: 4.3 10*3/uL — ABNORMAL LOW (ref 4.5–10.5)

## 2014-01-02 LAB — COMPREHENSIVE METABOLIC PANEL
ALT: 133 U/L — AB (ref 0–53)
AST: 72 U/L — ABNORMAL HIGH (ref 0–37)
Albumin: 4.4 g/dL (ref 3.5–5.2)
Alkaline Phosphatase: 74 U/L (ref 39–117)
BUN: 11 mg/dL (ref 6–23)
CALCIUM: 9.5 mg/dL (ref 8.4–10.5)
CO2: 28 meq/L (ref 19–32)
CREATININE: 0.9 mg/dL (ref 0.4–1.5)
Chloride: 99 mEq/L (ref 96–112)
GFR: 91.3 mL/min (ref >60.00)
GLUCOSE: 430 mg/dL — AB (ref 70–99)
Potassium: 3.8 mEq/L (ref 3.5–5.1)
Sodium: 135 mEq/L (ref 135–145)
Total Bilirubin: 0.3 mg/dL (ref 0.2–1.2)
Total Protein: 7.9 g/dL (ref 6.0–8.3)

## 2014-01-02 LAB — LIPID PANEL
CHOL/HDL RATIO: 5
CHOLESTEROL: 158 mg/dL (ref 0–200)
HDL: 29.7 mg/dL — AB (ref >39.00)
LDL Cholesterol: 78 mg/dL (ref 0–99)
TRIGLYCERIDES: 252 mg/dL — AB (ref 0.0–149.0)
VLDL: 50.4 mg/dL — ABNORMAL HIGH (ref 0.0–40.0)

## 2014-01-02 LAB — HEMOGLOBIN A1C: Hgb A1c MFr Bld: 9.2 % — ABNORMAL HIGH (ref 4.6–6.5)

## 2014-01-02 LAB — GLUCOSE, POCT (MANUAL RESULT ENTRY): POC GLUCOSE: 418 mg/dL — AB (ref 70–99)

## 2014-01-02 MED ORDER — INSULIN DETEMIR 100 UNIT/ML ~~LOC~~ SOLN: 40.0000 [IU] | Freq: Every day | SUBCUTANEOUS | Status: AC

## 2014-01-02 MED ORDER — BAYER CONTOUR NEXT EZ W/DEVICE KIT: 1.0000 | PACK | Freq: Three times a day (TID) | Status: AC

## 2014-01-02 MED ORDER — GLUCOSE BLOOD VI STRP: ORAL_STRIP | Status: AC

## 2014-01-02 MED ORDER — TRAMADOL HCL 50 MG PO TABS: ORAL_TABLET | ORAL | Status: DC

## 2014-01-02 MED ORDER — CANAGLIFLOZIN 300 MG PO TABS: 1.0000 | ORAL_TABLET | Freq: Every day | ORAL | Status: AC

## 2014-01-02 NOTE — Progress Notes (Signed)
Subjective:    Patient ID: Adam Horton, male    DOB: 08/17/1966, 48 y.o.   MRN: [id number]  HPI Comments: Translator with him today  Diabetes He has type 2 diabetes mellitus. There are no hypoglycemic associated symptoms. Pertinent negatives for hypoglycemia include no dizziness, headaches or tremors. Associated symptoms include blurred vision, polydipsia, polyphagia and polyuria. Pertinent negatives for diabetes include no chest pain, no fatigue, no foot paresthesias, no foot ulcerations, no visual change, no weakness and no weight loss. There are no hypoglycemic complications. Symptoms are worsening. There are no diabetic complications. Current diabetic treatment includes insulin injections and oral agent (monotherapy). He is compliant with treatment some of the time. His weight is stable. He is following a generally healthy diet. He never participates in exercise. There is no change in his home blood glucose trend. An ACE inhibitor/angiotensin II receptor blocker is being taken. He does not see a podiatrist.Eye exam is not current.      Review of Systems  Constitutional: Negative for fever, chills, weight loss, diaphoresis, activity change, appetite change, fatigue and unexpected weight change.  HENT: Negative.   Eyes: Positive for blurred vision.  Respiratory: Negative for cough, choking, chest tightness, shortness of breath, wheezing and stridor.   Cardiovascular: Negative.  Negative for chest pain, palpitations and leg swelling.  Gastrointestinal: Negative.  Negative for nausea, vomiting, abdominal pain, diarrhea and constipation.  Endocrine: Positive for polydipsia, polyphagia and polyuria.  Genitourinary: Negative.   Musculoskeletal: Positive for arthralgias. Negative for back pain, gait problem, joint swelling, myalgias, neck pain and neck stiffness.       Aching pain in knees and elbows, well controlled with the occasional dose of tramadol.  Skin: Negative.   Allergic/Immunologic:  Negative.   Neurological: Negative.  Negative for dizziness, tremors, weakness, light-headedness, numbness and headaches.  Hematological: Negative.  Negative for adenopathy. Does not bruise/bleed easily.  Psychiatric/Behavioral: Negative.        Objective:   Physical Exam  Vitals reviewed. Constitutional: He is oriented to person, place, and time. He appears well-developed and well-nourished. No distress.  HENT:  Head: Normocephalic and atraumatic.  Mouth/Throat: Oropharynx is clear and moist. No oropharyngeal exudate.  Eyes: Conjunctivae are normal. Right eye exhibits no discharge. Left eye exhibits no discharge. No scleral icterus.  Neck: Normal range of motion. Neck supple. No JVD present. No tracheal deviation present. No thyromegaly present.  Cardiovascular: Normal rate, regular rhythm, normal heart sounds and intact distal pulses.  Exam reveals no gallop and no friction rub.   No murmur heard. Pulmonary/Chest: Effort normal and breath sounds normal. No stridor. No respiratory distress. He has no wheezes. He has no rales. He exhibits no tenderness.  Abdominal: Soft. Bowel sounds are normal. He exhibits no distension and no mass. There is no tenderness. There is no rebound and no guarding.  Musculoskeletal: Normal range of motion. He exhibits no edema and no tenderness.  Lymphadenopathy:    He has no cervical adenopathy.  Neurological: He is oriented to person, place, and time.  Skin: Skin is warm and dry. No rash noted. He is not diaphoretic. No erythema. No pallor.  Psychiatric: He has a normal mood and affect. His behavior is normal. Judgment and thought content normal.     Lab Results  Component Value Date   WBC 4.8 06/15/2013   HGB 15.3 06/15/2013   HCT 48.0 06/15/2013   PLT 224.0 06/15/2013   GLUCOSE 449* 06/15/2013   CHOL 195 06/15/2013   TRIG 276.0* 06/15/2013  HDL 35.80* 06/15/2013   LDLDIRECT 110.7 06/15/2013   ALT 111* 06/15/2013   AST 55* 06/15/2013   NA  133* 06/15/2013   K 4.7 06/15/2013   CL 95* 06/15/2013   CREATININE 1.1 06/15/2013   BUN 18 06/15/2013   CO2 31 06/15/2013   TSH 0.67 06/15/2013   PSA 0.37 06/15/2013   INR 1.04 01/27/2013   HGBA1C 10.5* 06/15/2013   MICROALBUR 0.4 06/15/2013       Assessment & Plan:

## 2014-01-02 NOTE — Telephone Encounter (Signed)
Addressed at 01/02/14 OV. Closing phone note.

## 2014-01-02 NOTE — Assessment & Plan Note (Signed)
I will recheck his lipid panel today

## 2014-01-02 NOTE — Progress Notes (Signed)
Pre visit review using our clinic review tool, if applicable. No additional management support is needed unless otherwise documented below in the visit note. 

## 2014-01-02 NOTE — Patient Instructions (Signed)
Type 2 Diabetes Mellitus, Adult Type 2 diabetes mellitus, often simply referred to as type 2 diabetes, is a long-lasting (chronic) disease. In type 2 diabetes, the pancreas does not make enough insulin (a hormone), the cells are less responsive to the insulin that is made (insulin resistance), or both. Normally, insulin moves sugars from food into the tissue cells. The tissue cells use the sugars for energy. The lack of insulin or the lack of normal response to insulin causes excess sugars to build up in the blood instead of going into the tissue cells. As a result, high blood sugar (hyperglycemia) develops. The effect of high sugar (glucose) levels can cause many complications. Type 2 diabetes was also previously called adult-onset diabetes but it can occur at any age.  RISK FACTORS  A person is predisposed to developing type 2 diabetes if someone in the family has the disease and also has one or more of the following primary risk factors:  Overweight.  An inactive lifestyle.  A history of consistently eating high-calorie foods. Maintaining a normal weight and regular physical activity can reduce the chance of developing type 2 diabetes. SYMPTOMS  A person with type 2 diabetes may not show symptoms initially. The symptoms of type 2 diabetes appear slowly. The symptoms include:  Increased thirst (polydipsia).  Increased urination (polyuria).  Increased urination during the night (nocturia).  Weight loss. This weight loss may be rapid.  Frequent, recurring infections.  Tiredness (fatigue).  Weakness.  Vision changes, such as blurred vision.  Fruity smell to your breath.  Abdominal pain.  Nausea or vomiting.  Cuts or bruises which are slow to heal.  Tingling or numbness in the hands or feet. DIAGNOSIS Type 2 diabetes is frequently not diagnosed until complications of diabetes are present. Type 2 diabetes is diagnosed when symptoms or complications are present and when blood  glucose levels are increased. Your blood glucose level may be checked by one or more of the following blood tests:  A fasting blood glucose test. You will not be allowed to eat for at least 8 hours before a blood sample is taken.  A random blood glucose test. Your blood glucose is checked at any time of the day regardless of when you ate.  A hemoglobin A1c blood glucose test. A hemoglobin A1c test provides information about blood glucose control over the previous 3 months.  An oral glucose tolerance test (OGTT). Your blood glucose is measured after you have not eaten (fasted) for 2 hours and then after you drink a glucose-containing beverage. TREATMENT   You may need to take insulin or diabetes medicine daily to keep blood glucose levels in the desired range.  You will need to match insulin dosing with exercise and healthy food choices. The treatment goal is to maintain the before meal blood sugar (preprandial glucose) level at 70 130 mg/dL. HOME CARE INSTRUCTIONS   Have your hemoglobin A1c level checked twice a year.  Perform daily blood glucose monitoring as directed by your caregiver.  Monitor urine ketones when you are ill and as directed by your caregiver.  Take your diabetes medicine or insulin as directed by your caregiver to maintain your blood glucose levels in the desired range.  Never run out of diabetes medicine or insulin. It is needed every day.  Adjust insulin based on your intake of carbohydrates. Carbohydrates can raise blood glucose levels but need to be included in your diet. Carbohydrates provide vitamins, minerals, and fiber which are an essential part of   a healthy diet. Carbohydrates are found in fruits, vegetables, whole grains, dairy products, legumes, and foods containing added sugars.    Eat healthy foods. Alternate 3 meals with 3 snacks.  Lose weight if overweight.  Carry a medical alert card or wear your medical alert jewelry.  Carry a 15 gram  carbohydrate snack with you at all times to treat low blood glucose (hypoglycemia). Some examples of 15 gram carbohydrate snacks include:  Glucose tablets, 3 or 4   Glucose gel, 15 gram tube  Raisins, 2 tablespoons (24 grams)  Jelly beans, 6  Animal crackers, 8  Regular pop, 4 ounces (120 mL)  Gummy treats, 9  Recognize hypoglycemia. Hypoglycemia occurs with blood glucose levels of 70 mg/dL and below. The risk for hypoglycemia increases when fasting or skipping meals, during or after intense exercise, and during sleep. Hypoglycemia symptoms can include:  Tremors or shakes.  Decreased ability to concentrate.  Sweating.  Increased heart rate.  Headache.  Dry mouth.  Hunger.  Irritability.  Anxiety.  Restless sleep.  Altered speech or coordination.  Confusion.  Treat hypoglycemia promptly. If you are alert and able to safely swallow, follow the 15:15 rule:  Take 15 20 grams of rapid-acting glucose or carbohydrate. Rapid-acting options include glucose gel, glucose tablets, or 4 ounces (120 mL) of fruit juice, regular soda, or low fat milk.  Check your blood glucose level 15 minutes after taking the glucose.  Take 15 20 grams more of glucose if the repeat blood glucose level is still 70 mg/dL or below.  Eat a meal or snack within 1 hour once blood glucose levels return to normal.    Be alert to polyuria and polydipsia which are early signs of hyperglycemia. An early awareness of hyperglycemia allows for prompt treatment. Treat hyperglycemia as directed by your caregiver.  Engage in at least 150 minutes of moderate-intensity physical activity a week, spread over at least 3 days of the week or as directed by your caregiver. In addition, you should engage in resistance exercise at least 2 times a week or as directed by your caregiver.  Adjust your medicine and food intake as needed if you start a new exercise or sport.  Follow your sick day plan at any time you  are unable to eat or drink as usual.  Avoid tobacco use.  Limit alcohol intake to no more than 1 drink per day for nonpregnant women and 2 drinks per day for men. You should drink alcohol only when you are also eating food. Talk with your caregiver whether alcohol is safe for you. Tell your caregiver if you drink alcohol several times a week.  Follow up with your caregiver regularly.  Schedule an eye exam soon after the diagnosis of type 2 diabetes and then annually.  Perform daily skin and foot care. Examine your skin and feet daily for cuts, bruises, redness, nail problems, bleeding, blisters, or sores. A foot exam by a caregiver should be done annually.  Brush your teeth and gums at least twice a day and floss at least once a day. Follow up with your dentist regularly.  Share your diabetes management plan with your workplace or school.  Stay up-to-date with immunizations.  Learn to manage stress.  Obtain ongoing diabetes education and support as needed.  Participate in, or seek rehabilitation as needed to maintain or improve independence and quality of life. Request a physical or occupational therapy referral if you are having foot or hand numbness or difficulties with grooming,   dressing, eating, or physical activity. SEEK MEDICAL CARE IF:   You are unable to eat food or drink fluids for more than 6 hours.  You have nausea and vomiting for more than 6 hours.  Your blood glucose level is over 240 mg/dL.  There is a change in mental status.  You develop an additional serious illness.  You have diarrhea for more than 6 hours.  You have been sick or have had a fever for a couple of days and are not getting better.  You have pain during any physical activity.  SEEK IMMEDIATE MEDICAL CARE IF:  You have difficulty breathing.  You have moderate to large ketone levels. MAKE SURE YOU:  Understand these instructions.  Will watch your condition.  Will get help right away if  you are not doing well or get worse. Document Released: 08/17/2005 Document Revised: 05/11/2012 Document Reviewed: 03/15/2012 [employer] Patient Information 2014 [employer].  

## 2014-01-02 NOTE — Assessment & Plan Note (Signed)
His BP is well controlled I will check his lytes and renal function today 

## 2014-01-02 NOTE — Assessment & Plan Note (Signed)
He will cont tramadol as needed

## 2014-01-02 NOTE — Assessment & Plan Note (Signed)
His blood sugars are not well controlled I have asked him to restart levemir and at a higher dose He will stay on metformin but I think invokana is a good choice for him as well I have asked him to see diabetic education and to have his annual eye exam done He will start checking his blood sugars

## 2014-01-02 NOTE — Assessment & Plan Note (Signed)
He is due for a recheck on the AFP and [facility] He does not want to treat the Hep C infection

## 2014-01-03 ENCOUNTER — Telehealth: Payer: Self-pay | Admitting: Internal Medicine

## 2014-01-03 LAB — AFP TUMOR MARKER: AFP TUMOR MARKER: 2 ng/mL (ref 0.0–8.0)

## 2014-01-03 NOTE — Telephone Encounter (Signed)
Called [employer] [place] for PA of Contour Next Strips.  PA has been faxed to insurance.

## 2014-01-03 NOTE — Assessment & Plan Note (Signed)
Improvement noted 

## 2014-01-03 NOTE — Telephone Encounter (Signed)
Relevant patient education mailed to patient.  

## 2014-01-04 ENCOUNTER — Ambulatory Visit: Payer: BC Managed Care – PPO | Admitting: Internal Medicine

## 2014-01-04 ENCOUNTER — Telehealth: Payer: Self-pay | Admitting: Internal Medicine

## 2014-01-04 NOTE — Telephone Encounter (Signed)
PA for Contour Next Test Strips approved Faxed pharm approved pa LVMM with patient

## 2014-02-03 ENCOUNTER — Other Ambulatory Visit: Payer: Self-pay | Admitting: Internal Medicine

## 2014-03-13 ENCOUNTER — Other Ambulatory Visit: Payer: Self-pay | Admitting: *Deleted

## 2014-03-13 MED ORDER — PANTOPRAZOLE SODIUM 40 MG PO TBEC: DELAYED_RELEASE_TABLET | ORAL | Status: AC

## 2014-03-15 ENCOUNTER — Telehealth: Payer: Self-pay | Admitting: Internal Medicine

## 2014-03-15 NOTE — Telephone Encounter (Signed)
Pt refill for tramadol, pt request 90 days supply? Please advise.

## 2014-03-16 MED ORDER — TRAMADOL HCL 50 MG PO TABS: ORAL_TABLET | ORAL | Status: AC

## 2014-03-16 NOTE — Telephone Encounter (Signed)
Rx approved x 1 needing appointment prior to any more refills

## 2014-09-03 IMAGING — CT CT ABD-PELV W/ CM
1 of 3 series · 15 of 32 positions shown, 20 images · IV contrast (OMNIPAQUE 300)
Comparison: 09/19/2012

CLINICAL DATA: Chest pain.  Abdominal pain.

CT ABDOMEN AND PELVIS WITH CONTRAST
TECHNIQUE: Multidetector CT imaging of the abdomen and pelvis was
performed following the standard protocol during bolus
administration of intravenous contrast.
Contrast: 100mL OMNIPAQUE IOHEXOL 300 MG/ML  SOLN, 50mL OMNIPAQUE
IOHEXOL 300 MG/ML  SOLN

[Series 2: abd/pel with · axial · 0.68mm/px · z∈[+465,+840]mm · 15 of 85 slices shown, 20 images]
[im 5/85  soft-tissue]
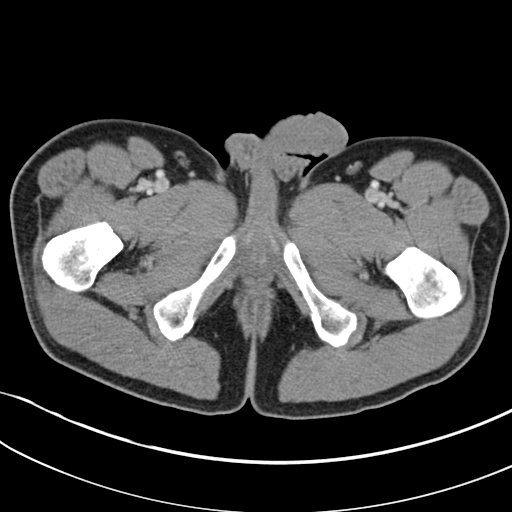
[im 5/85  bone]
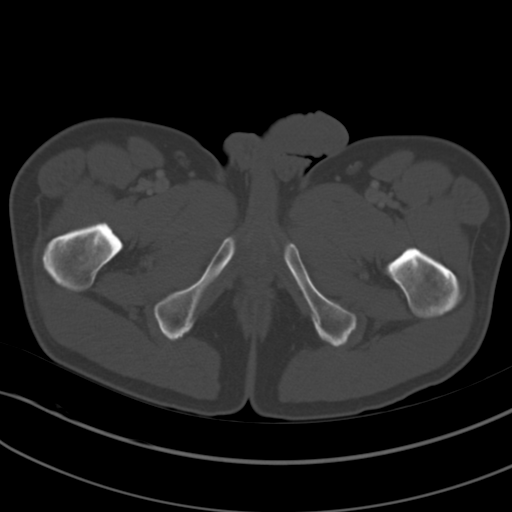
[im 9/85  soft-tissue]
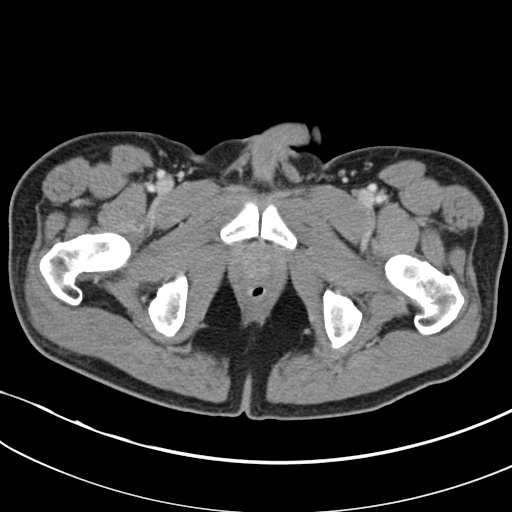
[im 18/85  soft-tissue]
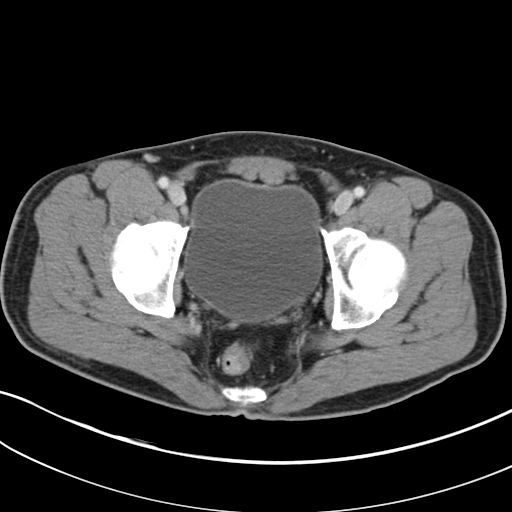
[im 23/85  soft-tissue]
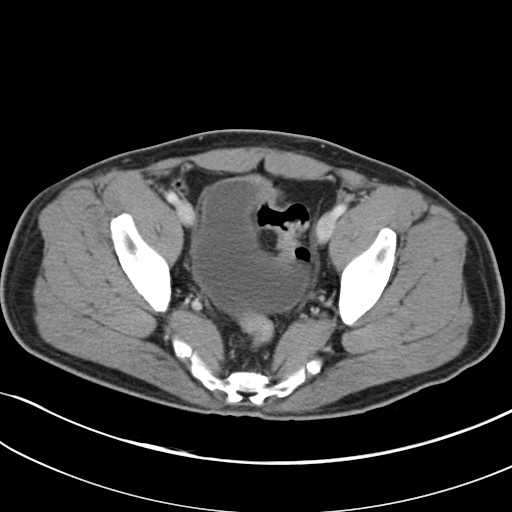
[im 27/85  soft-tissue]
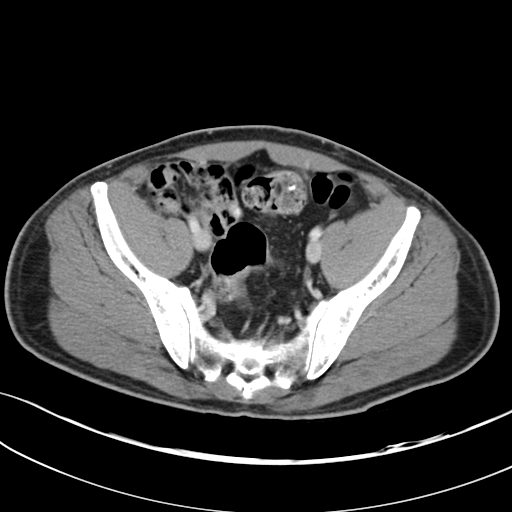
[im 36/85  soft-tissue]
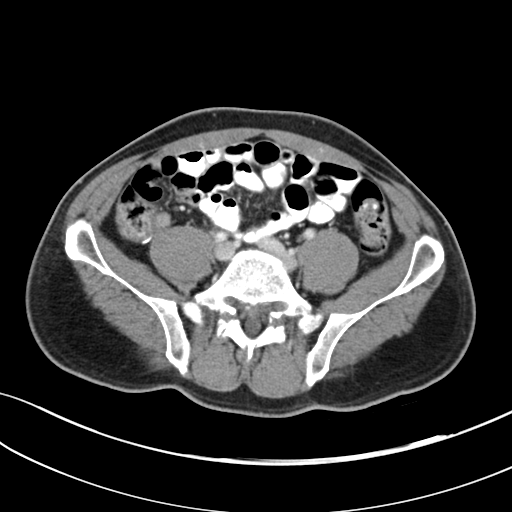
[im 40/85  soft-tissue]
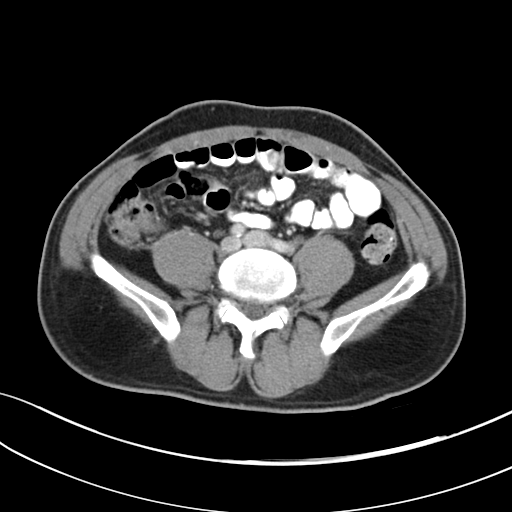
[im 45/85  soft-tissue]
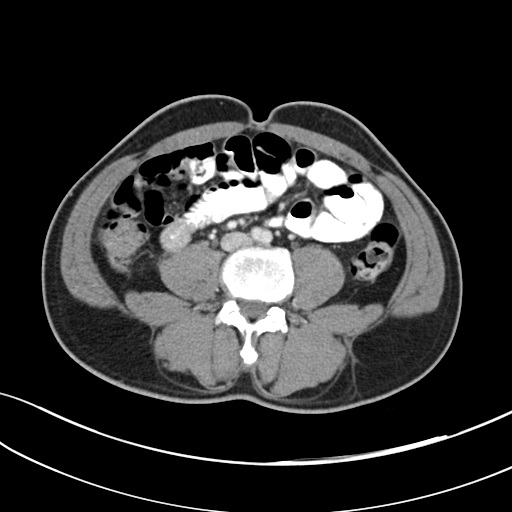
[im 49/85  soft-tissue]
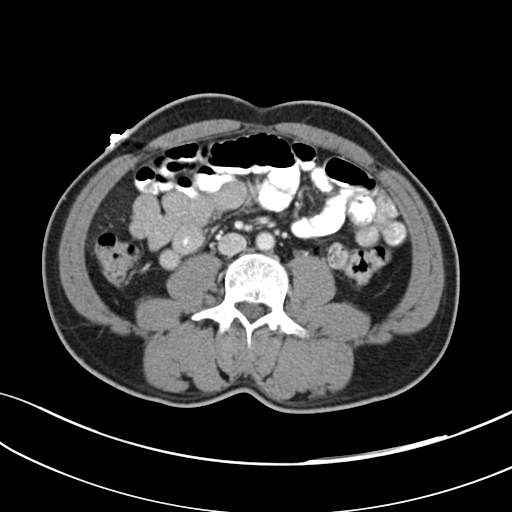
[im 49/85  bone]
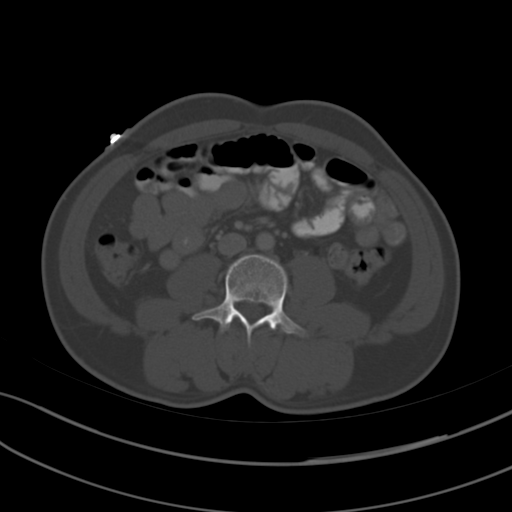
[im 58/85  soft-tissue]
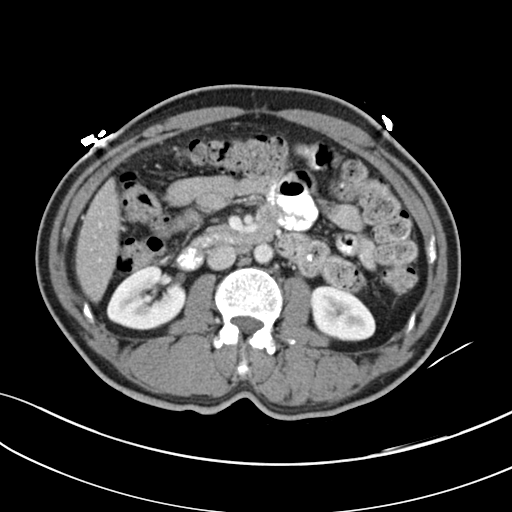
[im 62/85  soft-tissue]
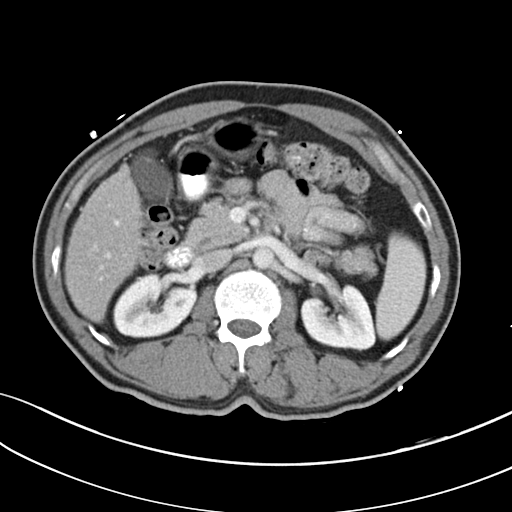
[im 67/85  soft-tissue]
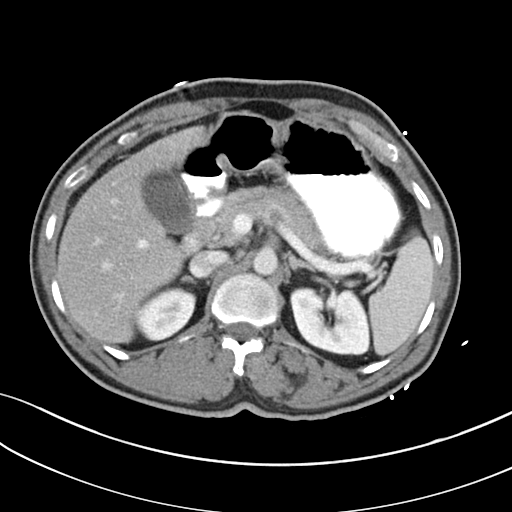
[im 67/85  lung]
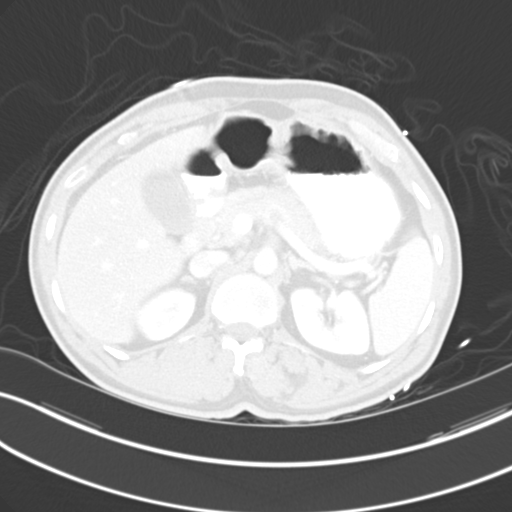
[im 71/85  lung]
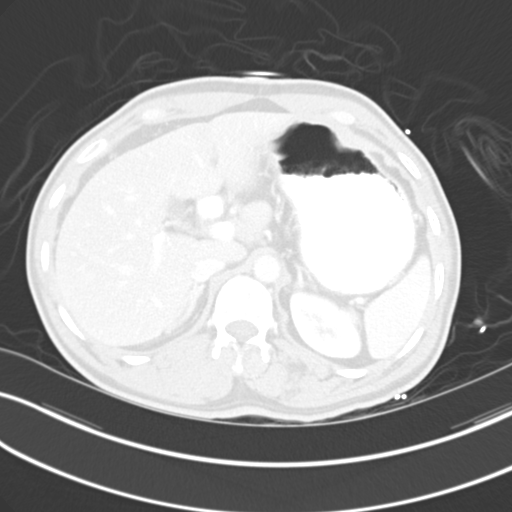
[im 76/85  soft-tissue]
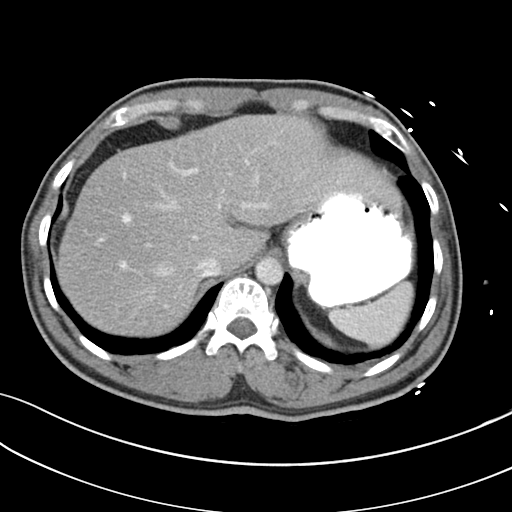
[im 76/85  lung]
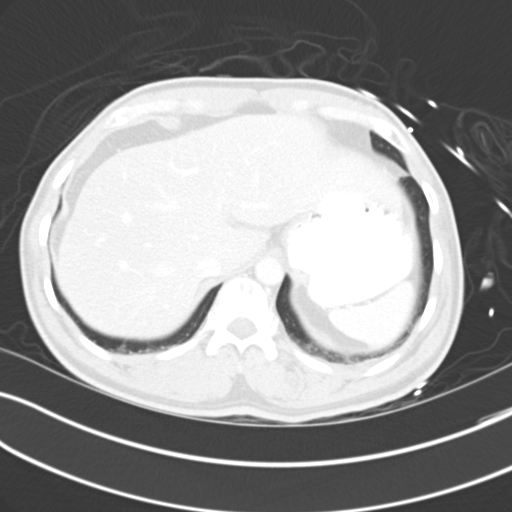
[im 80/85  soft-tissue]
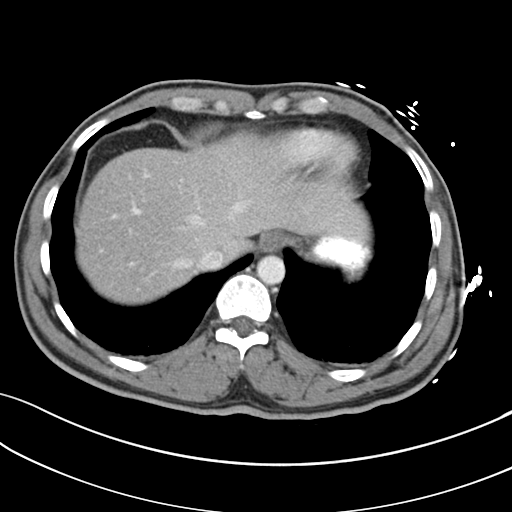
[im 80/85  lung]
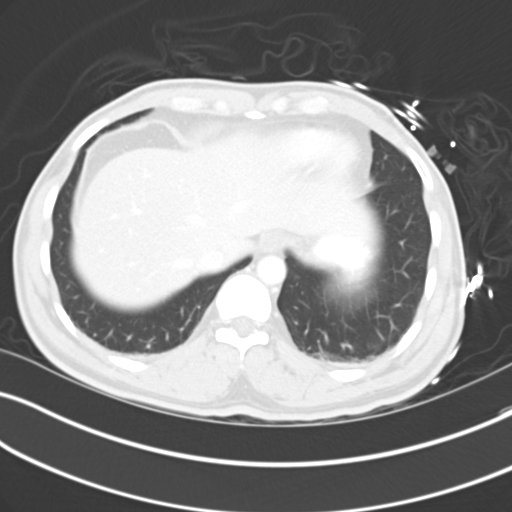

[15 of 32 positions shown; findings below may reference images not displayed]

FINDINGS: Diffuse hepatic steatosis.

Gallbladder, spleen, pancreas, adrenal glands, kidneys are within
normal limits.

Bladder is distended.  Unremarkable prostate.

No free fluid.  No abnormal adenopathy.  Post appendectomy clips.

Grade 1 L5-S1 spondylolisthesis.
IMPRESSION: No acute intra-abdominal or intrapelvic pathology.  Stable.

## 2017-10-12 ENCOUNTER — Encounter (HOSPITAL_COMMUNITY): Payer: Self-pay | Admitting: *Deleted

## 2017-10-12 ENCOUNTER — Emergency Department (HOSPITAL_COMMUNITY)
Admission: EM | Admit: 2017-10-12 | Discharge: 2017-10-12 | Disposition: A | Discharge status: Home / Self Care | Payer: BLUE CROSS/BLUE SHIELD | Attending: Emergency Medicine | Admitting: Emergency Medicine

## 2017-10-12 ENCOUNTER — Emergency Department (HOSPITAL_COMMUNITY): Payer: BLUE CROSS/BLUE SHIELD

## 2017-10-12 DIAGNOSIS — Z794 Long term (current) use of insulin: Secondary | ICD-10-CM | POA: Insufficient documentation

## 2017-10-12 DIAGNOSIS — R05 Cough: Secondary | ICD-10-CM | POA: Diagnosis present

## 2017-10-12 DIAGNOSIS — Z79899 Other long term (current) drug therapy: Secondary | ICD-10-CM | POA: Diagnosis not present

## 2017-10-12 DIAGNOSIS — I10 Essential (primary) hypertension: Secondary | ICD-10-CM | POA: Insufficient documentation

## 2017-10-12 DIAGNOSIS — J209 Acute bronchitis, unspecified: Secondary | ICD-10-CM | POA: Insufficient documentation

## 2017-10-12 MED ORDER — PREDNISONE 10 MG PO TABS: 40.0000 mg | ORAL_TABLET | Freq: Every day | ORAL | 0 refills | Status: AC

## 2017-10-12 MED ORDER — FLUTICASONE PROPIONATE 50 MCG/ACT NA SUSP: 1.0000 | Freq: Every day | NASAL | 0 refills | Status: AC

## 2017-10-12 MED ORDER — ALBUTEROL SULFATE HFA 108 (90 BASE) MCG/ACT IN AERS
2.0000 | INHALATION_SPRAY | Freq: Once | RESPIRATORY_TRACT | Status: AC
  Administered 2017-10-12: 2 via RESPIRATORY_TRACT
  Filled 2017-10-12: qty 6.7

## 2017-10-12 NOTE — ED Notes (Signed)
Patient transported to X-ray 

## 2017-10-12 NOTE — Discharge Instructions (Signed)
You likely have a viral illness.  This should be treated symptomatically. Use Tylenol or ibuprofen as needed for fevers or body aches. Use Flonase daily for nasal congestion and cough. Take prednisone as prescribed.  Use albuterol inhaler as needed for shortness of breath or chest tightness. Make sure you stay well-hydrated with water. Wash your hands frequently to prevent spread of infection. Follow-up with your primary care doctor in 1 week if your symptoms are not improving. Return to the emergency room if you develop chest pain, difficulty breathing, or any new or worsening symptoms.

## 2017-10-12 NOTE — ED Provider Notes (Signed)
[facility] EMERGENCY DEPARTMENT Provider Note   CSN: [id number] Arrival date & time: 10/12/17  1011     History   Chief Complaint Chief Complaint  Patient presents with  . Cough    HPI Adam Horton is a 52 y.o. male presenting for evaluation of cough, shortness of breath, and nasal congestion.   History provided by the pt through a medical interpreter.   Pt states he started to have sxs in January. He was evaluated and given cough syrup, which has not helped much. He presents today b/c his cough is persistent, despite time and medication. He states cough is productive, worse at night. He has associated nasal congestion.  He describes shortness of breath and chest tightness when he is coughing.  He does not have shortness of breath currently.  He denies fevers, chills, eye pain, ear pain, sore throat, chest pain, nausea, vomiting, abdominal pain.   No sick contacts. No h/o COPD or asthma.   HPI  Past Medical History:  Diagnosis Date  . Diabetes mellitus without complication ([place])   . DM (diabetes mellitus) ([place])   . GERD (gastroesophageal reflux disease)   . Hemorrhoids   . Hepatitis C   . HTN (hypertension)     Patient Active Problem List   Diagnosis Date Noted  . Pure hyperglyceridemia 01/02/2014  . Hyperlipidemia LDL goal <100 01/02/2014  . DJD (degenerative joint disease) of knee 01/02/2014  . Unspecified vitamin D deficiency 06/16/2013  . Type II or unspecified type diabetes mellitus without mention of complication, uncontrolled 06/15/2013  . Routine general medical examination at a health care facility 06/15/2013  . History of acute gastritis 03/28/2013  . Hepatitis C 03/28/2013  . History of tuberculosis 01/28/2013  . HTN (hypertension) 01/20/2013    Past Surgical History:  Procedure Laterality Date  . APPENDECTOMY    . ESOPHAGOGASTRODUODENOSCOPY N/A 01/21/2013   Procedure: ESOPHAGOGASTRODUODENOSCOPY (EGD);  Surgeon: Lafayette Dragon, MD;   Location: [facility] ENDOSCOPY;  Service: Endoscopy;  Laterality: N/A;  . VIDEO BRONCHOSCOPY Bilateral 01/27/2013   Procedure: VIDEO BRONCHOSCOPY WITHOUT FLUORO;  Surgeon: Collene Gobble, MD;  Location: [facility];  Service: Cardiopulmonary;  Laterality: Bilateral;       Home Medications    Prior to Admission medications   Medication Sig Start Date End Date Taking? Authorizing Provider  Alum & Mag Hydroxide-Simeth (GI COCKTAIL) SUSP suspension Take 30 mLs by mouth 2 (two) times daily. Shake well.    [provider]  Blood Glucose Monitoring Suppl (CONTOUR NEXT EZ MONITOR) W/DEVICE KIT 1 Act by Does not apply route 3 (three) times daily. 01/02/14   Janith Lima, MD  Canagliflozin (INVOKANA) 300 MG TABS Take 1 tablet (300 mg total) by mouth daily. 01/02/14   Janith Lima, MD  Cholecalciferol 50000 UNITS TABS Take 1 tablet by mouth once a week. 06/16/13   Janith Lima, MD  fluticasone (FLONASE) 50 MCG/ACT nasal spray Place 1 spray into both nostrils daily. 10/12/17   Alfreddie Consalvo, PA-C  glucose blood (BAYER CONTOUR NEXT TEST) test strip Use as instructed 01/02/14   Janith Lima, MD  hydrocortisone (ANUSOL-HC) 25 MG suppository Place 25 mg rectally as needed for hemorrhoids.    [provider]  insulin detemir (LEVEMIR) 100 UNIT/ML injection Inject 0.4 mLs (40 Units total) into the skin daily. 01/02/14   Janith Lima, MD  lisinopril (PRINIVIL,ZESTRIL) 5 MG tablet TAKE A HALF TABLET BY MOUTH EVERY DAY 07/24/13   Janith Lima,  MD  metFORMIN (GLUCOPHAGE-XR) 500 MG 24 hr tablet TAKE 2 TABLETS BY MOUTH TWICE A DAY 02/03/14   Janith Lima, MD  OVER THE COUNTER MEDICATION Place 1 drop into the left eye 2 (two) times daily as needed (otc eye drop prn pain).    [provider]  pantoprazole (PROTONIX) 40 MG tablet TAKE 1 TABLET BY MOUTH EVERY DAY 03/13/14   Lafayette Dragon, MD  predniSONE (DELTASONE) 10 MG tablet Take 4 tablets (40 mg total) by mouth daily for 4 days.  10/12/17 10/16/17  Brecklynn Jian, PA-C  sucralfate (CARAFATE) 1 G tablet TAKE 1 TABLET BY MOUTH 4 TIMES A DAY (3O MINUTES BEFORE EATING) 07/24/13   Janith Lima, MD  traMADol (ULTRAM) 50 MG tablet TAKE 1 TABLET BY MOUTH EVERY 8 HOURS AS NEEDED FOR PAIN 03/16/14   Janith Lima, MD    Family History No family history on file.  Social History Social History   Tobacco Use  . Smoking status: Never Smoker  . Smokeless tobacco: Never Used  Substance Use Topics  . Alcohol use: No  . Drug use: No     Allergies   Food   Review of Systems Review of Systems  Constitutional: Negative for chills and fever.  HENT: Positive for congestion.   Respiratory: Positive for cough, chest tightness (not currently) and shortness of breath (not currently).   Cardiovascular: Negative for chest pain.     Physical Exam Updated Vital Signs BP 116/79 (BP Location: Right Arm)   Pulse 82   Temp 97.7 F (36.5 C) (Oral)   Resp 16   Ht _0  (1.626 m)   Wt 59 kg (130 lb)   SpO2 100%   BMI 22.31 kg/m   Physical Exam  Constitutional: He is oriented to person, place, and time. He appears well-developed and well-nourished. No distress.  HENT:  Head: Normocephalic and atraumatic.  Right Ear: Tympanic membrane, external ear and ear canal normal.  Left Ear: Tympanic membrane, external ear and ear canal normal.  Nose: Mucosal edema present. Right sinus exhibits no maxillary sinus tenderness and no frontal sinus tenderness. Left sinus exhibits no maxillary sinus tenderness and no frontal sinus tenderness.  Mouth/Throat: Uvula is midline, oropharynx is clear and moist and mucous membranes are normal. No tonsillar exudate.  Nasal mucosal edema. OP clear without tonsillar swelling or exudate. Uvula midline with equal palate rise. TMs nonerythematous without bulging.   Eyes: Conjunctivae and EOM are normal. Pupils are equal, round, and reactive to light.  Neck: Normal range of motion.  Cardiovascular:  Normal rate, regular rhythm and intact distal pulses.  Pulmonary/Chest: Effort normal and breath sounds normal. He has no decreased breath sounds. He has no wheezes. He has no rhonchi. He has no rales.  Pt speaking in full sentences without difficulty. Clear lung sounds in all fields  Abdominal: Soft. He exhibits no distension and no mass. There is no tenderness. There is no guarding.  Musculoskeletal: Normal range of motion.  Lymphadenopathy:    He has no cervical adenopathy.  Neurological: He is alert and oriented to person, place, and time.  Skin: Skin is warm.  Psychiatric: He has a normal mood and affect.  Nursing note and vitals reviewed.    ED Treatments / Results  Labs (all labs ordered are listed, but only abnormal results are displayed) Labs Reviewed - No data to display  EKG  EKG Interpretation None       Radiology Dg Chest 2 View  Result Date: 10/12/2017 CLINICAL DATA:  Productive cough. EXAM: CHEST  2 VIEW COMPARISON:  CT 01/20/2013.  Chest x-ray 01/14/2013. FINDINGS: Mediastinum and hilar structures normal. Heart size normal. No focal infiltrate. Stable nodular opacity right mid lung unchanged from prior CT of 01/20/2013 most likely a small granuloma. Mild bilateral pleural thickening most consistent with scarring again noted. Degenerative change thoracic spine. IMPRESSION: 1. Small nodular density right mid lung, unchanged from prior CT of 01/20/2013, most likely a small granuloma. Bilateral pleural-parenchymal thickening noted consistent with scarring. 2.  No acute infiltrates. Electronically Signed   By: Marcello Moores  Register   On: 10/12/2017 11:16    Procedures Procedures (including critical care time)  Medications Ordered in ED Medications  albuterol (PROVENTIL HFA;VENTOLIN HFA) 108 (90 Base) MCG/ACT inhaler 2 puff (2 puffs Inhalation Given 10/12/17 1247)     Initial Impression / Assessment and Plan / ED Course  I have reviewed the triage vital signs and the  nursing notes.  Pertinent labs & imaging results that were available during my care of the patient were reviewed by me and considered in my medical decision making (see chart for details).     Patient presenting with cough and congestion x2 wks.  Physical exam reassuring, patient is afebrile and appears nontoxic.  Pulmonary exam reassuring.  Doubt pneumonia, strep, other bacterial infection, or peritonsillar abscess. CXR viewed and interpreted by me. Shows no sign of PNA or acute findings. Stable nodule.  Likely viral bronchitis. Will give albuterol inhaler here, and treat symptomatically.  Patient to follow-up with primary care as needed.  At this time, patient appears safe for discharge.  Return precautions given.  Patient states he understands and agrees to plan.   Final Clinical Impressions(s) / ED Diagnoses   Final diagnoses:  Acute bronchitis, unspecified organism    ED Discharge Orders        Ordered    predniSONE (DELTASONE) 10 MG tablet  Daily     10/12/17 1229    fluticasone (FLONASE) 50 MCG/ACT nasal spray  Daily     10/12/17 [address], PA-C 10/12/17 1407    Davonna Belling, MD 10/12/17 1556

## 2017-10-12 NOTE — ED Notes (Signed)
Declined W/C at D/C and was escorted to lobby by RN. 

## 2017-10-12 NOTE — ED Triage Notes (Signed)
Pt states that he has had a productive  cough for several weeks. Pt  Was seen and given a cough syrup with some relief. Pt states that it is continuing though.
